# Patient Record
Sex: Male | Born: 1935 | Race: White | Hispanic: No | Smoking: Former smoker
Health system: Southern US, Community
[De-identification: ages and names within clinical notes are randomized; demographics above are authoritative.]

## PROBLEM LIST (undated history)

## (undated) DIAGNOSIS — I10 Essential (primary) hypertension: Secondary | ICD-10-CM

## (undated) DIAGNOSIS — E7800 Pure hypercholesterolemia, unspecified: Secondary | ICD-10-CM

## (undated) DIAGNOSIS — H409 Unspecified glaucoma: Secondary | ICD-10-CM

## (undated) HISTORY — PX: CATARACT EXTRACTION, BILATERAL: SHX1313

## (undated) HISTORY — PX: HERNIA REPAIR: SHX51

## (undated) HISTORY — PX: COLONOSCOPY: SHX174

## (undated) HISTORY — PX: OTHER SURGICAL HISTORY: SHX169

---

## 1998-08-04 ENCOUNTER — Ambulatory Visit (HOSPITAL_COMMUNITY): Admission: RE | Admit: 1998-08-04 | Discharge: 1998-08-04 | Payer: Self-pay | Admitting: Specialist

## 2002-08-14 ENCOUNTER — Ambulatory Visit (HOSPITAL_COMMUNITY): Admission: RE | Admit: 2002-08-14 | Discharge: 2002-08-14 | Payer: Self-pay | Admitting: Internal Medicine

## 2011-05-23 DIAGNOSIS — H4010X0 Unspecified open-angle glaucoma, stage unspecified: Secondary | ICD-10-CM | POA: Diagnosis not present

## 2011-05-23 DIAGNOSIS — H0019 Chalazion unspecified eye, unspecified eyelid: Secondary | ICD-10-CM | POA: Diagnosis not present

## 2011-05-26 DIAGNOSIS — E785 Hyperlipidemia, unspecified: Secondary | ICD-10-CM | POA: Diagnosis not present

## 2011-05-26 DIAGNOSIS — E781 Pure hyperglyceridemia: Secondary | ICD-10-CM | POA: Diagnosis not present

## 2011-05-26 DIAGNOSIS — F411 Generalized anxiety disorder: Secondary | ICD-10-CM | POA: Diagnosis not present

## 2011-05-26 DIAGNOSIS — I10 Essential (primary) hypertension: Secondary | ICD-10-CM | POA: Diagnosis not present

## 2011-05-26 DIAGNOSIS — Z125 Encounter for screening for malignant neoplasm of prostate: Secondary | ICD-10-CM | POA: Diagnosis not present

## 2011-05-26 DIAGNOSIS — E119 Type 2 diabetes mellitus without complications: Secondary | ICD-10-CM | POA: Diagnosis not present

## 2011-08-01 DIAGNOSIS — H4011X0 Primary open-angle glaucoma, stage unspecified: Secondary | ICD-10-CM | POA: Diagnosis not present

## 2011-08-01 DIAGNOSIS — H409 Unspecified glaucoma: Secondary | ICD-10-CM | POA: Diagnosis not present

## 2011-09-14 DIAGNOSIS — H409 Unspecified glaucoma: Secondary | ICD-10-CM | POA: Diagnosis not present

## 2011-09-14 DIAGNOSIS — H4010X0 Unspecified open-angle glaucoma, stage unspecified: Secondary | ICD-10-CM | POA: Diagnosis not present

## 2011-09-14 DIAGNOSIS — H0019 Chalazion unspecified eye, unspecified eyelid: Secondary | ICD-10-CM | POA: Diagnosis not present

## 2011-09-14 DIAGNOSIS — H4011X0 Primary open-angle glaucoma, stage unspecified: Secondary | ICD-10-CM | POA: Diagnosis not present

## 2012-04-29 DIAGNOSIS — E782 Mixed hyperlipidemia: Secondary | ICD-10-CM | POA: Diagnosis not present

## 2012-04-29 DIAGNOSIS — R7309 Other abnormal glucose: Secondary | ICD-10-CM | POA: Diagnosis not present

## 2012-04-30 DIAGNOSIS — E669 Obesity, unspecified: Secondary | ICD-10-CM | POA: Diagnosis not present

## 2012-04-30 DIAGNOSIS — I10 Essential (primary) hypertension: Secondary | ICD-10-CM | POA: Diagnosis not present

## 2012-04-30 DIAGNOSIS — Z23 Encounter for immunization: Secondary | ICD-10-CM | POA: Diagnosis not present

## 2012-04-30 DIAGNOSIS — E785 Hyperlipidemia, unspecified: Secondary | ICD-10-CM | POA: Diagnosis not present

## 2012-06-27 DIAGNOSIS — H353190 Nonexudative age-related macular degeneration, unspecified eye, stage unspecified: Secondary | ICD-10-CM | POA: Diagnosis not present

## 2012-06-27 DIAGNOSIS — H43819 Vitreous degeneration, unspecified eye: Secondary | ICD-10-CM | POA: Diagnosis not present

## 2012-06-27 DIAGNOSIS — H409 Unspecified glaucoma: Secondary | ICD-10-CM | POA: Diagnosis not present

## 2012-06-27 DIAGNOSIS — H4011X0 Primary open-angle glaucoma, stage unspecified: Secondary | ICD-10-CM | POA: Diagnosis not present

## 2012-10-23 DIAGNOSIS — J392 Other diseases of pharynx: Secondary | ICD-10-CM | POA: Diagnosis not present

## 2012-10-23 DIAGNOSIS — R350 Frequency of micturition: Secondary | ICD-10-CM | POA: Diagnosis not present

## 2012-10-23 DIAGNOSIS — J069 Acute upper respiratory infection, unspecified: Secondary | ICD-10-CM | POA: Diagnosis not present

## 2012-11-14 DIAGNOSIS — H2510 Age-related nuclear cataract, unspecified eye: Secondary | ICD-10-CM | POA: Diagnosis not present

## 2012-11-14 DIAGNOSIS — H353190 Nonexudative age-related macular degeneration, unspecified eye, stage unspecified: Secondary | ICD-10-CM | POA: Diagnosis not present

## 2012-11-14 DIAGNOSIS — H409 Unspecified glaucoma: Secondary | ICD-10-CM | POA: Diagnosis not present

## 2012-11-14 DIAGNOSIS — H47239 Glaucomatous optic atrophy, unspecified eye: Secondary | ICD-10-CM | POA: Diagnosis not present

## 2012-11-14 DIAGNOSIS — H43819 Vitreous degeneration, unspecified eye: Secondary | ICD-10-CM | POA: Diagnosis not present

## 2013-04-17 DIAGNOSIS — H353190 Nonexudative age-related macular degeneration, unspecified eye, stage unspecified: Secondary | ICD-10-CM | POA: Diagnosis not present

## 2013-04-17 DIAGNOSIS — H409 Unspecified glaucoma: Secondary | ICD-10-CM | POA: Diagnosis not present

## 2013-04-17 DIAGNOSIS — H47239 Glaucomatous optic atrophy, unspecified eye: Secondary | ICD-10-CM | POA: Diagnosis not present

## 2013-04-17 DIAGNOSIS — H43819 Vitreous degeneration, unspecified eye: Secondary | ICD-10-CM | POA: Diagnosis not present

## 2013-04-28 DIAGNOSIS — Z0000 Encounter for general adult medical examination without abnormal findings: Secondary | ICD-10-CM | POA: Diagnosis not present

## 2013-04-28 DIAGNOSIS — Z79899 Other long term (current) drug therapy: Secondary | ICD-10-CM | POA: Diagnosis not present

## 2013-04-28 DIAGNOSIS — Z6830 Body mass index (BMI) 30.0-30.9, adult: Secondary | ICD-10-CM | POA: Diagnosis not present

## 2013-04-28 DIAGNOSIS — Z23 Encounter for immunization: Secondary | ICD-10-CM | POA: Diagnosis not present

## 2013-04-30 ENCOUNTER — Encounter (INDEPENDENT_AMBULATORY_CARE_PROVIDER_SITE_OTHER): Payer: Self-pay | Admitting: *Deleted

## 2013-05-23 ENCOUNTER — Telehealth (INDEPENDENT_AMBULATORY_CARE_PROVIDER_SITE_OTHER): Payer: Self-pay | Admitting: *Deleted

## 2013-05-23 ENCOUNTER — Encounter (INDEPENDENT_AMBULATORY_CARE_PROVIDER_SITE_OTHER): Payer: Self-pay | Admitting: *Deleted

## 2013-05-23 ENCOUNTER — Other Ambulatory Visit (INDEPENDENT_AMBULATORY_CARE_PROVIDER_SITE_OTHER): Payer: Self-pay | Admitting: *Deleted

## 2013-05-23 DIAGNOSIS — Z1211 Encounter for screening for malignant neoplasm of colon: Secondary | ICD-10-CM

## 2013-05-23 MED ORDER — PEG-KCL-NACL-NASULF-NA ASC-C 100 G PO SOLR: 1.0000 | Freq: Once | ORAL | Status: DC

## 2013-05-23 NOTE — Telephone Encounter (Signed)
Patient needs movi prep 

## 2013-06-19 DIAGNOSIS — H47239 Glaucomatous optic atrophy, unspecified eye: Secondary | ICD-10-CM | POA: Diagnosis not present

## 2013-06-19 DIAGNOSIS — H4011X0 Primary open-angle glaucoma, stage unspecified: Secondary | ICD-10-CM | POA: Diagnosis not present

## 2013-06-19 DIAGNOSIS — H3530 Unspecified macular degeneration: Secondary | ICD-10-CM | POA: Diagnosis not present

## 2013-06-19 DIAGNOSIS — H43819 Vitreous degeneration, unspecified eye: Secondary | ICD-10-CM | POA: Diagnosis not present

## 2013-06-23 ENCOUNTER — Encounter (HOSPITAL_COMMUNITY): Payer: Self-pay | Admitting: Pharmacy Technician

## 2013-06-25 ENCOUNTER — Telehealth (INDEPENDENT_AMBULATORY_CARE_PROVIDER_SITE_OTHER): Payer: Self-pay | Admitting: *Deleted

## 2013-06-25 NOTE — Telephone Encounter (Signed)
Agree.  Please list their allergies.

## 2013-06-25 NOTE — Telephone Encounter (Signed)
  Procedure: tcs  Reason/Indication:  screening  Has patient had this procedure before?  Yes, 10 years ago  If so, when, by whom and where?    Is there a family history of colon cancer?  no  Who?  What age when diagnosed?    Is patient diabetic?   no      Does patient have prosthetic heart valve?  no  Do you have a pacemaker?  no  Has patient ever had endocarditis? no  Has patient had joint replacement within last 12 months?  no  Does patient tend to be constipated or take laxatives? no  Is patient on Coumadin, Plavix and/or Aspirin? yes  Medications: asa 325 mg daily, lipitor 20 mg 1/2 tab daily, diovan 160 mg /2 tab daily, fish oil  Allergies: see EPIC  Medication Adjustment: asa 2 days  Procedure date & time: 07/24/13 at 730

## 2013-07-24 ENCOUNTER — Ambulatory Visit (HOSPITAL_COMMUNITY)
Admission: RE | Admit: 2013-07-24 | Discharge: 2013-07-24 | Disposition: A | Discharge status: Home / Self Care | Payer: Medicare Other | Source: Ambulatory Visit | Attending: Internal Medicine | Admitting: Internal Medicine

## 2013-07-24 ENCOUNTER — Encounter (HOSPITAL_COMMUNITY): Payer: Self-pay | Admitting: *Deleted

## 2013-07-24 ENCOUNTER — Encounter (HOSPITAL_COMMUNITY)
Admission: RE | Disposition: A | Discharge status: Home / Self Care | Payer: Self-pay | Source: Ambulatory Visit | Attending: Internal Medicine

## 2013-07-24 DIAGNOSIS — E7800 Pure hypercholesterolemia, unspecified: Secondary | ICD-10-CM | POA: Diagnosis not present

## 2013-07-24 DIAGNOSIS — K644 Residual hemorrhoidal skin tags: Secondary | ICD-10-CM | POA: Insufficient documentation

## 2013-07-24 DIAGNOSIS — I10 Essential (primary) hypertension: Secondary | ICD-10-CM | POA: Insufficient documentation

## 2013-07-24 DIAGNOSIS — Z79899 Other long term (current) drug therapy: Secondary | ICD-10-CM | POA: Insufficient documentation

## 2013-07-24 DIAGNOSIS — Z87891 Personal history of nicotine dependence: Secondary | ICD-10-CM | POA: Diagnosis not present

## 2013-07-24 DIAGNOSIS — Z1211 Encounter for screening for malignant neoplasm of colon: Secondary | ICD-10-CM | POA: Diagnosis not present

## 2013-07-24 DIAGNOSIS — D126 Benign neoplasm of colon, unspecified: Secondary | ICD-10-CM | POA: Insufficient documentation

## 2013-07-24 DIAGNOSIS — K5730 Diverticulosis of large intestine without perforation or abscess without bleeding: Secondary | ICD-10-CM | POA: Insufficient documentation

## 2013-07-24 DIAGNOSIS — Z7982 Long term (current) use of aspirin: Secondary | ICD-10-CM | POA: Insufficient documentation

## 2013-07-24 DIAGNOSIS — K648 Other hemorrhoids: Secondary | ICD-10-CM | POA: Insufficient documentation

## 2013-07-24 HISTORY — DX: Unspecified glaucoma: H40.9

## 2013-07-24 HISTORY — PX: COLONOSCOPY: SHX5424

## 2013-07-24 HISTORY — DX: Pure hypercholesterolemia, unspecified: E78.00

## 2013-07-24 HISTORY — DX: Essential (primary) hypertension: I10

## 2013-07-24 SURGERY — COLONOSCOPY
Anesthesia: Moderate Sedation

## 2013-07-24 MED ORDER — MEPERIDINE HCL 50 MG/ML IJ SOLN
INTRAMUSCULAR | Status: AC
  Filled 2013-07-24: qty 1

## 2013-07-24 MED ORDER — SODIUM CHLORIDE 0.9 % IV SOLN
INTRAVENOUS | Status: DC
  Administered 2013-07-24: 07:00:00 via INTRAVENOUS

## 2013-07-24 MED ORDER — ATROPINE SULFATE 1 MG/ML IJ SOLN
INTRAMUSCULAR | Status: AC
  Filled 2013-07-24: qty 1

## 2013-07-24 MED ORDER — MEPERIDINE HCL 50 MG/ML IJ SOLN
INTRAMUSCULAR | Status: DC | PRN
  Administered 2013-07-24: 25 mg via INTRAVENOUS

## 2013-07-24 MED ORDER — STERILE WATER FOR IRRIGATION IR SOLN
Status: DC | PRN
  Administered 2013-07-24: 07:00:00

## 2013-07-24 MED ORDER — MIDAZOLAM HCL 5 MG/5ML IJ SOLN
INTRAMUSCULAR | Status: AC
  Filled 2013-07-24: qty 10

## 2013-07-24 MED ORDER — MIDAZOLAM HCL 5 MG/5ML IJ SOLN
INTRAMUSCULAR | Status: DC | PRN
  Administered 2013-07-24: 1 mg via INTRAVENOUS
  Administered 2013-07-24 (×2): 2 mg via INTRAVENOUS

## 2013-07-24 MED ORDER — ATROPINE SULFATE 1 MG/ML IJ SOLN
INTRAMUSCULAR | Status: DC | PRN
  Administered 2013-07-24: .5 mg via INTRAVENOUS

## 2013-07-24 NOTE — H&P (Signed)
Kirk Taylor is an 80 y.o. male.   Chief Complaint: Patient's here for colonoscopy. HPI: Patient is 80 year old Caucasian male who is here for screening colonoscopy. He denies abdominal pain change in his bowel habits or rectal bleeding. Patient's Lasix was 11 years ago. Family history is negative for CRC.  Past Medical History  Diagnosis Date  . Hypertension   . Hypercholesteremia   . Glaucoma     Past Surgical History  Procedure Laterality Date  . Colonoscopy    . Cataract extraction, bilateral    . Bilateral laser eye surgery    . Hernia repair Bilateral     Family History  Problem Relation Age of Onset  . Colon cancer Neg Hx    Social History:  reports that he has quit smoking. His smoking use included Cigarettes. He has a 5 pack-year smoking history. He does not have any smokeless tobacco history on file. He reports that he drinks about 3.5 ounces of alcohol per week. He reports that he does not use illicit drugs.  Allergies:  Allergies  Allergen Reactions  . Codeine Itching    Causes burning   . Latex     Causes skin irritation and burning    Medications Prior to Admission  Medication Sig Dispense Refill  . aspirin EC 81 MG tablet Take 81 mg by mouth daily.      Marland Kitchen atorvastatin (LIPITOR) 20 MG tablet Take 10 mg by mouth daily.      . dorzolamide-timolol (COSOPT) 22.3-6.8 MG/ML ophthalmic solution Place 1 drop into the left eye 2 (two) times daily.      . Multiple Vitamins-Minerals (OCUVITE PRESERVISION PO) Take 1 tablet by mouth 2 (two) times daily.      Marland Kitchen NIACIN PO Take 1 tablet by mouth daily.      . Omega-3 Fatty Acids (FISH OIL PO) Take 4,000 mg by mouth daily.      . Tafluprost (ZIOPTAN) 0.0015 % SOLN Place 1 drop into both eyes at bedtime.      . valsartan (DIOVAN) 160 MG tablet Take 80 mg by mouth daily.        No results found for this or any previous visit (from the past 48 hour(s)). No results found.  ROS  Blood pressure 155/73, pulse 69,  temperature 97.7 F (36.5 C), temperature source Oral, resp. rate 14, height 5\' 10"  (1.778 m), weight 190 lb (86.183 kg), SpO2 97.00%. Physical Exam  Constitutional: He appears well-developed and well-nourished.  HENT:  Mouth/Throat: Oropharynx is clear and moist.  Eyes: Conjunctivae are normal.  Neck: No thyromegaly present.  Cardiovascular: Normal rate, regular rhythm and normal heart sounds.   No murmur heard. Respiratory: Effort normal and breath sounds normal.  GI: Soft. He exhibits no distension and no mass. There is no tenderness.  Musculoskeletal: He exhibits no edema.  Lymphadenopathy:    He has no cervical adenopathy.  Neurological: He is alert.  Skin: Skin is warm and dry.     Assessment/Plan Average risk screening colonoscopy.  REHMAN,NAJEEB U 07/24/2013, 7:31 AM

## 2013-07-24 NOTE — Op Note (Signed)
COLONOSCOPY PROCEDURE REPORT  PATIENT:  Kirk Taylor  MR#:  [id number] Birthdate:  06/16/1933, 80 y.o., male Endoscopist:  Dr. Rogene Houston, MD Referred By:  Dr. Rocky Morel, MD  Procedure Date: 07/24/2013  Procedure:   Colonoscopy  Indications: Patient is 80 year old Caucasian male who is here for average risk screening colonoscopy. His last exam was in April 2004.  Informed Consent:  The procedure and risks were reviewed with the patient and informed consent was obtained.  Medications:  Demerol 25 mg IV Versed 5 mg IV Atropine 0.5 mg IV for asymptomatic bradycardia.  Description of procedure:  After a digital rectal exam was performed, that colonoscope was advanced from the anus through the rectum and colon to the area of the cecum, ileocecal valve and appendiceal orifice. The cecum was deeply intubated. These structures were well-seen and photographed for the record. From the level of the cecum and ileocecal valve, the scope was slowly and cautiously withdrawn. The mucosal surfaces were carefully surveyed utilizing scope tip to flexion to facilitate fold flattening as needed. The scope was pulled down into the rectum where a thorough exam including retroflexion was performed.  Findings:   Prep satisfactory. Scattered diverticula at sigmoid colon.  7 mm polyp snared from the distal sigmoid colon. Small hemorrhoids above and below the dentate line.   Therapeutic/Diagnostic Maneuvers Performed:  See above  Complications:  None  Cecal Withdrawal Time:  12 minutes  Impression:  Examination performed to cecum. Scattered diverticula at sigmoid colon. 7 mm polyp snared from distal sigmoid colon. Internal/external hemorrhoids.  Recommendations:  Standard instructions given. High fiber diet. I will contact patient with biopsy results and further recommendations.  Jalisha Enneking U  07/24/2013 8:20 AM  CC: Dr. Ethlyn Gallery, Kassie Mends, MD & Dr. Rayne Du ref. provider  found

## 2013-07-24 NOTE — Discharge Instructions (Signed)
Resume usual medications except no aspirin or NSAIDs for 1 week High fiber diet. No driving for 24 hours. Physician will call with biopsy results.  Colonoscopy, Care After Refer to this sheet in the next few weeks. These instructions provide you with information on caring for yourself after your procedure. Your health care provider may also give you more specific instructions. Your treatment has been planned according to current medical practices, but problems sometimes occur. Call your health care provider if you have any problems or questions after your procedure. WHAT TO EXPECT AFTER THE PROCEDURE  After your procedure, it is typical to have the following:  A small amount of blood in your stool.  Moderate amounts of gas and mild abdominal cramping or bloating. HOME CARE INSTRUCTIONS  Do not drive, operate machinery, or sign important documents for 24 hours.  You may shower and resume your regular physical activities, but move at a slower pace for the first 24 hours.  Take frequent rest periods for the first 24 hours.  Walk around or put a warm pack on your abdomen to help reduce abdominal cramping and bloating.  Drink enough fluids to keep your urine clear or pale yellow.  You may resume your normal diet as instructed by your health care provider. Avoid heavy or fried foods that are hard to digest.  Avoid drinking alcohol for 24 hours or as instructed by your health care provider.  Only take over-the-counter or prescription medicines as directed by your health care provider.  If a tissue sample (biopsy) was taken during your procedure:  Do not take aspirin or blood thinners for 7 days, or as instructed by your health care provider.  Do not drink alcohol for 7 days, or as instructed by your health care provider.  Eat soft foods for the first 24 hours. SEEK MEDICAL CARE IF: You have persistent spotting of blood in your stool 2 3 days after the procedure. SEEK IMMEDIATE  MEDICAL CARE IF:  You have more than a small spotting of blood in your stool.  You pass large blood clots in your stool.  Your abdomen is swollen (distended).  You have nausea or vomiting.  You have a fever.  You have increasing abdominal pain that is not relieved with medicine.   High-Fiber Diet Fiber is found in fruits, vegetables, and grains. A high-fiber diet encourages the addition of more whole grains, legumes, fruits, and vegetables in your diet. The recommended amount of fiber for adult males is 38 g per day. For adult females, it is 25 g per day. Pregnant and lactating women should get 28 g of fiber per day. If you have a digestive or bowel problem, ask your caregiver for advice before adding high-fiber foods to your diet. Eat a variety of high-fiber foods instead of only a select few type of foods.  PURPOSE  To increase stool bulk.  To make bowel movements more regular to prevent constipation.  To lower cholesterol.  To prevent overeating. WHEN IS THIS DIET USED?  It may be used if you have constipation and hemorrhoids.  It may be used if you have uncomplicated diverticulosis (intestine condition) and irritable bowel syndrome.  It may be used if you need help with weight management.  It may be used if you want to add it to your diet as a protective measure against atherosclerosis, diabetes, and cancer. SOURCES OF FIBER  Whole-grain breads and cereals.  Fruits, such as apples, oranges, bananas, berries, prunes, and pears.  Vegetables,  such as green peas, carrots, sweet potatoes, beets, broccoli, cabbage, spinach, and artichokes.  Legumes, such split peas, soy, lentils.  Almonds. FIBER CONTENT IN FOODS Starches and Grains / Dietary Fiber (g)  Cheerios, 1 cup / 3 g  Corn Flakes cereal, 1 cup / 0.7 g  Rice crispy treat cereal, 1 cup / 0.3 g  Instant oatmeal (cooked),  cup / 2 g  Frosted wheat cereal, 1 cup / 5.1 g  Brown, long-grain rice (cooked), 1  cup / 3.5 g  White, long-grain rice (cooked), 1 cup / 0.6 g  Enriched macaroni (cooked), 1 cup / 2.5 g Legumes / Dietary Fiber (g)  Baked beans (canned, plain, or vegetarian),  cup / 5.2 g  Kidney beans (canned),  cup / 6.8 g  Pinto beans (cooked),  cup / 5.5 g Breads and Crackers / Dietary Fiber (g)  Plain or honey graham crackers, 2 squares / 0.7 g  Saltine crackers, 3 squares / 0.3 g  Plain, salted pretzels, 10 pieces / 1.8 g  Whole-wheat bread, 1 slice / 1.9 g  White bread, 1 slice / 0.7 g  Raisin bread, 1 slice / 1.2 g  Plain bagel, 3 oz / 2 g  Flour tortilla, 1 oz / 0.9 g  Corn tortilla, 1 small / 1.5 g  Hamburger or hotdog bun, 1 small / 0.9 g Fruits / Dietary Fiber (g)  Apple with skin, 1 medium / 4.4 g  Sweetened applesauce,  cup / 1.5 g  Banana,  medium / 1.5 g  Grapes, 10 grapes / 0.4 g  Orange, 1 small / 2.3 g  Raisin, 1.5 oz / 1.6 g  Melon, 1 cup / 1.4 g Vegetables / Dietary Fiber (g)  Green beans (canned),  cup / 1.3 g  Carrots (cooked),  cup / 2.3 g  Broccoli (cooked),  cup / 2.8 g  Peas (cooked),  cup / 4.4 g  Mashed potatoes,  cup / 1.6 g  Lettuce, 1 cup / 0.5 g  Corn (canned),  cup / 1.6 g  Tomato,  cup / 1.1 g

## 2013-07-29 ENCOUNTER — Encounter (HOSPITAL_COMMUNITY): Payer: Self-pay | Admitting: Internal Medicine

## 2013-08-07 ENCOUNTER — Encounter (INDEPENDENT_AMBULATORY_CARE_PROVIDER_SITE_OTHER): Payer: Self-pay | Admitting: *Deleted

## 2013-09-16 DIAGNOSIS — H43819 Vitreous degeneration, unspecified eye: Secondary | ICD-10-CM | POA: Diagnosis not present

## 2013-09-16 DIAGNOSIS — H4011X0 Primary open-angle glaucoma, stage unspecified: Secondary | ICD-10-CM | POA: Diagnosis not present

## 2013-09-16 DIAGNOSIS — H47239 Glaucomatous optic atrophy, unspecified eye: Secondary | ICD-10-CM | POA: Diagnosis not present

## 2014-01-20 DIAGNOSIS — H47239 Glaucomatous optic atrophy, unspecified eye: Secondary | ICD-10-CM | POA: Diagnosis not present

## 2014-01-20 DIAGNOSIS — H3530 Unspecified macular degeneration: Secondary | ICD-10-CM | POA: Diagnosis not present

## 2014-01-20 DIAGNOSIS — H16229 Keratoconjunctivitis sicca, not specified as Sjogren's, unspecified eye: Secondary | ICD-10-CM | POA: Diagnosis not present

## 2014-06-24 DIAGNOSIS — Z125 Encounter for screening for malignant neoplasm of prostate: Secondary | ICD-10-CM | POA: Diagnosis not present

## 2014-06-24 DIAGNOSIS — E782 Mixed hyperlipidemia: Secondary | ICD-10-CM | POA: Diagnosis not present

## 2014-06-24 DIAGNOSIS — Z6831 Body mass index (BMI) 31.0-31.9, adult: Secondary | ICD-10-CM | POA: Diagnosis not present

## 2014-06-24 DIAGNOSIS — R7301 Impaired fasting glucose: Secondary | ICD-10-CM | POA: Diagnosis not present

## 2014-06-24 DIAGNOSIS — J069 Acute upper respiratory infection, unspecified: Secondary | ICD-10-CM | POA: Diagnosis not present

## 2014-06-24 DIAGNOSIS — Z0000 Encounter for general adult medical examination without abnormal findings: Secondary | ICD-10-CM | POA: Diagnosis not present

## 2014-07-07 DIAGNOSIS — E6609 Other obesity due to excess calories: Secondary | ICD-10-CM | POA: Diagnosis not present

## 2014-07-07 DIAGNOSIS — Z6831 Body mass index (BMI) 31.0-31.9, adult: Secondary | ICD-10-CM | POA: Diagnosis not present

## 2014-07-07 DIAGNOSIS — K5289 Other specified noninfective gastroenteritis and colitis: Secondary | ICD-10-CM | POA: Diagnosis not present

## 2014-07-14 DIAGNOSIS — Z6830 Body mass index (BMI) 30.0-30.9, adult: Secondary | ICD-10-CM | POA: Diagnosis not present

## 2014-07-14 DIAGNOSIS — E6609 Other obesity due to excess calories: Secondary | ICD-10-CM | POA: Diagnosis not present

## 2014-07-14 DIAGNOSIS — A09 Infectious gastroenteritis and colitis, unspecified: Secondary | ICD-10-CM | POA: Diagnosis not present

## 2014-07-23 DIAGNOSIS — H16223 Keratoconjunctivitis sicca, not specified as Sjogren's, bilateral: Secondary | ICD-10-CM | POA: Diagnosis not present

## 2014-07-23 DIAGNOSIS — H4011X3 Primary open-angle glaucoma, severe stage: Secondary | ICD-10-CM | POA: Diagnosis not present

## 2014-07-31 DIAGNOSIS — E6609 Other obesity due to excess calories: Secondary | ICD-10-CM | POA: Diagnosis not present

## 2014-07-31 DIAGNOSIS — I10 Essential (primary) hypertension: Secondary | ICD-10-CM | POA: Diagnosis not present

## 2014-07-31 DIAGNOSIS — Z6830 Body mass index (BMI) 30.0-30.9, adult: Secondary | ICD-10-CM | POA: Diagnosis not present

## 2014-07-31 DIAGNOSIS — A09 Infectious gastroenteritis and colitis, unspecified: Secondary | ICD-10-CM | POA: Diagnosis not present

## 2014-07-31 DIAGNOSIS — E782 Mixed hyperlipidemia: Secondary | ICD-10-CM | POA: Diagnosis not present

## 2014-08-31 DIAGNOSIS — E663 Overweight: Secondary | ICD-10-CM | POA: Diagnosis not present

## 2014-08-31 DIAGNOSIS — A047 Enterocolitis due to Clostridium difficile: Secondary | ICD-10-CM | POA: Diagnosis not present

## 2014-08-31 DIAGNOSIS — Z6829 Body mass index (BMI) 29.0-29.9, adult: Secondary | ICD-10-CM | POA: Diagnosis not present

## 2014-09-22 ENCOUNTER — Encounter (INDEPENDENT_AMBULATORY_CARE_PROVIDER_SITE_OTHER): Payer: Self-pay | Admitting: *Deleted

## 2014-09-24 ENCOUNTER — Encounter (INDEPENDENT_AMBULATORY_CARE_PROVIDER_SITE_OTHER): Payer: Self-pay | Admitting: Internal Medicine

## 2014-09-24 ENCOUNTER — Ambulatory Visit (INDEPENDENT_AMBULATORY_CARE_PROVIDER_SITE_OTHER): Payer: Medicare Other | Admitting: Internal Medicine

## 2014-09-24 VITALS — BP 180/70 | HR 70 | Temp 97.8°F | Ht 70.0 in | Wt 188.2 lb

## 2014-09-24 DIAGNOSIS — A047 Enterocolitis due to Clostridium difficile: Secondary | ICD-10-CM

## 2014-09-24 DIAGNOSIS — H409 Unspecified glaucoma: Secondary | ICD-10-CM | POA: Insufficient documentation

## 2014-09-24 DIAGNOSIS — I10 Essential (primary) hypertension: Secondary | ICD-10-CM | POA: Insufficient documentation

## 2014-09-24 DIAGNOSIS — E7800 Pure hypercholesterolemia, unspecified: Secondary | ICD-10-CM | POA: Insufficient documentation

## 2014-09-24 DIAGNOSIS — A0472 Enterocolitis due to Clostridium difficile, not specified as recurrent: Secondary | ICD-10-CM

## 2014-09-24 NOTE — Progress Notes (Signed)
Subjective:    Patient ID: Kirk Taylor, male    DOB: 23-Nov-1933, 81 y.o.   MRN: [id number]  HPI Referred to our office by [place] (Dr. Gerarda Fraction) for C-diff. C-diff diagnosed about 8 weeks ago (March). He had been on antibiotics. He was placed on Amoxicillin for a sinus infection. He thinks the Amoxcillin started his problems. He developed diarrhea after 3-4 days and he stopped the Amoxicillin.  He has been treated with Flagyl  X 20 days and then Vancomycin  ? 225 mg qid  x 3 weeks. He finished the Vancomycin 2 days ago.  He tells me his stool are fine today.  His stools are formed now and soft.  He usually has one stool a day. He has not seen any mucous. No rectal bleeding. Stools are not foul smelling now.  Appetite is good. No weight loss. He is on a Probiotic    07/23/2013 Colonoscopy Dr. Laural Golden: Average risk: Impression:   Examination performed to cecum. Scattered diverticula at sigmoid colon. 7 mm polyp snared from distal sigmoid colon. Biopsy: Polyp was a tubular adenoma.  . 07/11/2014 Stool culture negative.  07/07/2014 C diff positive 06/24/2014 WBC 5.9, H and H 13.5 and 38.8, MCV 91, Albumin 3.9, total bili 0.2, ALP 68, AST 26, ALT 18        Review of Systems Past Medical History  Diagnosis Date  . Hypertension   . Hypercholesteremia   . Glaucoma     Past Surgical History  Procedure Laterality Date  . Colonoscopy    . Cataract extraction, bilateral    . Bilateral laser eye surgery    . Hernia repair Bilateral   . Colonoscopy N/A 07/24/2013    Procedure: COLONOSCOPY;  Surgeon: Rogene Houston, MD;  Location: [facility];  Service: Endoscopy;  Laterality: N/A;  730    Allergies  Allergen Reactions  . Codeine Itching    Causes burning   . Latex     Causes skin irritation and burning  . Tape     Current Outpatient Prescriptions on File Prior to Visit  Medication Sig Dispense Refill  . atorvastatin (LIPITOR) 20 MG tablet Take 10 mg by mouth daily.    .  dorzolamide-timolol (COSOPT) 22.3-6.8 MG/ML ophthalmic solution Place 1 drop into the left eye 2 (two) times daily.    . Multiple Vitamins-Minerals (OCUVITE PRESERVISION PO) Take 1 tablet by mouth 2 (two) times daily.    Marland Kitchen NIACIN PO Take 1 tablet by mouth daily.    . Omega-3 Fatty Acids (FISH OIL PO) Take 4,000 mg by mouth daily.    . Tafluprost (ZIOPTAN) 0.0015 % SOLN Place 1 drop into both eyes at bedtime.    . valsartan (DIOVAN) 160 MG tablet Take 80 mg by mouth daily.     No current facility-administered medications on file prior to visit.        Objective:   Physical Exam  Blood pressure 180/70, pulse 70, temperature 97.8 F (36.6 C), height 5\' 10"  (1.778 m), weight 188 lb 3.2 oz (85.367 kg).  Alert and oriented. Skin warm and dry. Oral mucosa is moist.   . Sclera anicteric, conjunctivae is pink. Thyroid not enlarged. No cervical lymphadenopathy. Lungs clear. Heart regular rate and rhythm.  Abdomen is soft. Bowel sounds are positive. No hepatomegaly. No abdominal masses felt. No tenderness.  No edema to lower extremities.       Assessment & Plan:  C diff. He stools have been formed for 2  days. OV in 4 weeks. If diarrhea reoccurs call our office.

## 2014-09-24 NOTE — Patient Instructions (Addendum)
OV in 2 weeks. If diarrhea reoccurs, call our office and we will get a c-diff.

## 2014-09-25 ENCOUNTER — Encounter (INDEPENDENT_AMBULATORY_CARE_PROVIDER_SITE_OTHER): Payer: Self-pay

## 2014-09-25 NOTE — Addendum Note (Signed)
Addended by: Butch Penny on: 09/25/2014 08:54 AM   Modules accepted: Level of Service

## 2014-10-12 ENCOUNTER — Telehealth (INDEPENDENT_AMBULATORY_CARE_PROVIDER_SITE_OTHER): Payer: Self-pay | Admitting: Internal Medicine

## 2014-10-12 DIAGNOSIS — A0472 Enterocolitis due to Clostridium difficile, not specified as recurrent: Secondary | ICD-10-CM

## 2014-10-12 NOTE — Telephone Encounter (Signed)
Labs for c diff ordered

## 2014-10-13 ENCOUNTER — Telehealth (INDEPENDENT_AMBULATORY_CARE_PROVIDER_SITE_OTHER): Payer: Self-pay | Admitting: Internal Medicine

## 2014-10-13 DIAGNOSIS — A0472 Enterocolitis due to Clostridium difficile, not specified as recurrent: Secondary | ICD-10-CM

## 2014-10-13 LAB — CLOSTRIDIUM DIFFICILE BY PCR: CDIFFPCR: DETECTED — AB

## 2014-10-13 MED ORDER — VANCOMYCIN HCL 125 MG PO CAPS: 125.0000 mg | ORAL_CAPSULE | Freq: Three times a day (TID) | ORAL | Status: DC

## 2014-10-13 NOTE — Telephone Encounter (Signed)
Rx sent to his pharmacy

## 2014-10-13 NOTE — Telephone Encounter (Signed)
Spoke with patient. Rx for Vancomycin

## 2014-10-14 ENCOUNTER — Telehealth (INDEPENDENT_AMBULATORY_CARE_PROVIDER_SITE_OTHER): Payer: Self-pay | Admitting: *Deleted

## 2014-10-14 NOTE — Telephone Encounter (Signed)
Sam said his father is coming to stay with him and his family. He is aware that we can discuss his fathers chart with him but would like to know how to protect his children from C-diff. If someone could please return his call at [phone].

## 2014-10-14 NOTE — Telephone Encounter (Signed)
Patient called in 10/13/2014 at 5:52 pm and would like for you to call him at his convenience.

## 2014-10-15 NOTE — Telephone Encounter (Signed)
Patient came by office today. Tammy is off. The PA will be done tomorrow when she returns.

## 2014-10-15 NOTE — Telephone Encounter (Signed)
I have talked with son. Did not release any information on patient. Gave precautions on C-diff.

## 2014-10-15 NOTE — Telephone Encounter (Signed)
Message left at home 

## 2014-10-16 ENCOUNTER — Telehealth (INDEPENDENT_AMBULATORY_CARE_PROVIDER_SITE_OTHER): Payer: Self-pay | Admitting: *Deleted

## 2014-10-16 NOTE — Telephone Encounter (Signed)
[facility] was called and a PA was completed for Vancomycin 250 mg Capsule - Take 1 by mouth three tiimes a day. #42 for 14 days. This is good through 12/11/14. Reference number is [id number]. Both the patient and the pharmacy have been notified.

## 2014-10-27 ENCOUNTER — Ambulatory Visit (INDEPENDENT_AMBULATORY_CARE_PROVIDER_SITE_OTHER): Payer: Medicare Other | Admitting: Internal Medicine

## 2014-10-27 ENCOUNTER — Encounter (INDEPENDENT_AMBULATORY_CARE_PROVIDER_SITE_OTHER): Payer: Self-pay | Admitting: Internal Medicine

## 2014-10-27 VITALS — BP 122/68 | HR 64 | Temp 97.8°F | Ht 70.5 in | Wt 186.0 lb

## 2014-10-27 DIAGNOSIS — B9689 Other specified bacterial agents as the cause of diseases classified elsewhere: Secondary | ICD-10-CM | POA: Diagnosis not present

## 2014-10-27 DIAGNOSIS — A498 Other bacterial infections of unspecified site: Secondary | ICD-10-CM

## 2014-10-27 NOTE — Progress Notes (Addendum)
Subjective:    Patient ID: Kirk Taylor, male    DOB: 08/27/1933, 81 y.o.   MRN: [id number]  HPI   Here today for f/u.  Recent hx of C difficile. Treated for C diff by Dr. Gerarda Fraction in March. He had been on Amoxicillin for a sinus infections. He developed diarrhea after being on the antibiotic for 3-4 days. He stopped the Amoxicillin.  He was treated with Flagyl x 20 days and then Vancomycin qid x 3 weeks. I recheck his GI pathogen on 10/12/2014 when he called and stated his diarrhea had returned. He was positive. He was started on Vancomycin 125mg  TID x 14 days. He will be finished Thursday. He tells me his stools are better. Stools are formed but soft.  No foul odor.  He is having one stool a day and sometimes he will skip a day. Appetite is good. Occasionally has some nausea. He still taking a probiotic    07/23/2013 Colonoscopy Dr. Laural Golden: Average risk: Impression:   Examination performed to cecum. Scattered diverticula at sigmoid colon. 7 mm polyp snared from distal sigmoid colon. Biopsy: Polyp was a tubular adenoma. . 07/11/2014 Stool culture negative.   07/07/2014 C diff positive 06/24/2014 WBC 5.9, H and H 13.5 and 38.8, MCV 91, Albumin 3.9, total bili 0.2, ALP 68, AST 26, ALT 18    Review of Systems Past Medical History  Diagnosis Date  . Hypertension   . Hypercholesteremia   . Glaucoma     Past Surgical History  Procedure Laterality Date  . Colonoscopy    . Cataract extraction, bilateral    . Bilateral laser eye surgery    . Hernia repair Bilateral   . Colonoscopy N/A 07/24/2013    Procedure: COLONOSCOPY;  Surgeon: Rogene Houston, MD;  Location: [facility];  Service: Endoscopy;  Laterality: N/A;  730    Allergies  Allergen Reactions  . Codeine Itching    Causes burning   . Latex     Causes skin irritation and burning  . Tape     Current Outpatient Prescriptions on File Prior to Visit  Medication Sig Dispense Refill  . atorvastatin (LIPITOR) 20 MG  tablet Take 10 mg by mouth daily.    . dorzolamide-timolol (COSOPT) 22.3-6.8 MG/ML ophthalmic solution Place 1 drop into the left eye 2 (two) times daily.    . Multiple Vitamins-Minerals (OCUVITE PRESERVISION PO) Take 1 tablet by mouth 2 (two) times daily.    Marland Kitchen NIACIN PO Take 1 tablet by mouth daily.    . Omega-3 Fatty Acids (FISH OIL PO) Take 4,000 mg by mouth daily.    . Tafluprost (ZIOPTAN) 0.0015 % SOLN Place 1 drop into both eyes at bedtime.    . valsartan (DIOVAN) 160 MG tablet Take 80 mg by mouth daily.    . vancomycin (VANCOCIN) 125 MG capsule Take 1 capsule (125 mg total) by mouth 3 (three) times daily. 42 capsule 0   No current facility-administered medications on file prior to visit.   Divorced. Works part time at [employer].      Objective:   Physical ExamBlood pressure 122/68, pulse 64, temperature 97.8 F (36.6 C), height 5' 10.5" (1.791 m), weight 186 lb (84.369 kg).  Alert and oriented. Skin warm and dry. Oral mucosa is moist.   . Sclera anicteric, conjunctivae is pink. Thyroid not enlarged. No cervical lymphadenopathy. Lungs clear. Heart regular rate and rhythm.  Abdomen is soft. Bowel sounds are positive. No hepatomegaly. No abdominal  masses felt. No tenderness.  No edema to lower extremities.       Assessment & Plan:  C difficile. Doing better. Stools are formed. Having one stool a day. Will finish Vancomycin in 2 days,. We will see back i  4 weeks. If diarrhea returns he will call our office and I will get a GI pathogen.

## 2014-10-27 NOTE — Patient Instructions (Signed)
OV in 4 weeks.  

## 2014-11-11 ENCOUNTER — Telehealth (INDEPENDENT_AMBULATORY_CARE_PROVIDER_SITE_OTHER): Payer: Self-pay | Admitting: *Deleted

## 2014-11-11 NOTE — Telephone Encounter (Signed)
Kirk Taylor said he was doing really good and will call if needed. He said Kirk Taylor told him he didn't need to come back if better. Has a question, how long should he continue taking the probiotic? His return phone number is [phone].

## 2014-11-12 NOTE — Telephone Encounter (Signed)
States he feels better. No diarrhea. Take the Probiotic for another week

## 2014-11-24 DIAGNOSIS — H16223 Keratoconjunctivitis sicca, not specified as Sjogren's, bilateral: Secondary | ICD-10-CM | POA: Diagnosis not present

## 2014-11-24 DIAGNOSIS — H4011X3 Primary open-angle glaucoma, severe stage: Secondary | ICD-10-CM | POA: Diagnosis not present

## 2015-03-09 DIAGNOSIS — H3530 Unspecified macular degeneration: Secondary | ICD-10-CM | POA: Diagnosis not present

## 2015-03-09 DIAGNOSIS — H16223 Keratoconjunctivitis sicca, not specified as Sjogren's, bilateral: Secondary | ICD-10-CM | POA: Diagnosis not present

## 2015-03-09 DIAGNOSIS — Z23 Encounter for immunization: Secondary | ICD-10-CM | POA: Diagnosis not present

## 2015-03-09 DIAGNOSIS — H401133 Primary open-angle glaucoma, bilateral, severe stage: Secondary | ICD-10-CM | POA: Diagnosis not present

## 2015-04-23 DIAGNOSIS — I10 Essential (primary) hypertension: Secondary | ICD-10-CM | POA: Diagnosis not present

## 2015-05-11 DIAGNOSIS — I10 Essential (primary) hypertension: Secondary | ICD-10-CM | POA: Diagnosis not present

## 2015-05-11 DIAGNOSIS — Z23 Encounter for immunization: Secondary | ICD-10-CM | POA: Diagnosis not present

## 2015-05-11 DIAGNOSIS — Z1389 Encounter for screening for other disorder: Secondary | ICD-10-CM | POA: Diagnosis not present

## 2015-05-11 DIAGNOSIS — Z6830 Body mass index (BMI) 30.0-30.9, adult: Secondary | ICD-10-CM | POA: Diagnosis not present

## 2015-05-11 DIAGNOSIS — A047 Enterocolitis due to Clostridium difficile: Secondary | ICD-10-CM | POA: Diagnosis not present

## 2015-05-11 DIAGNOSIS — E782 Mixed hyperlipidemia: Secondary | ICD-10-CM | POA: Diagnosis not present

## 2015-06-10 DIAGNOSIS — H401133 Primary open-angle glaucoma, bilateral, severe stage: Secondary | ICD-10-CM | POA: Diagnosis not present

## 2015-06-10 DIAGNOSIS — H16223 Keratoconjunctivitis sicca, not specified as Sjogren's, bilateral: Secondary | ICD-10-CM | POA: Diagnosis not present

## 2015-06-10 DIAGNOSIS — H3530 Unspecified macular degeneration: Secondary | ICD-10-CM | POA: Diagnosis not present

## 2015-06-11 ENCOUNTER — Other Ambulatory Visit (INDEPENDENT_AMBULATORY_CARE_PROVIDER_SITE_OTHER): Payer: Self-pay | Admitting: *Deleted

## 2015-06-11 DIAGNOSIS — Z1211 Encounter for screening for malignant neoplasm of colon: Secondary | ICD-10-CM

## 2015-07-16 DIAGNOSIS — Z0000 Encounter for general adult medical examination without abnormal findings: Secondary | ICD-10-CM | POA: Diagnosis not present

## 2015-07-16 DIAGNOSIS — Z6831 Body mass index (BMI) 31.0-31.9, adult: Secondary | ICD-10-CM | POA: Diagnosis not present

## 2015-07-16 DIAGNOSIS — Z1389 Encounter for screening for other disorder: Secondary | ICD-10-CM | POA: Diagnosis not present

## 2015-07-16 DIAGNOSIS — E6609 Other obesity due to excess calories: Secondary | ICD-10-CM | POA: Diagnosis not present

## 2015-07-27 DIAGNOSIS — H401133 Primary open-angle glaucoma, bilateral, severe stage: Secondary | ICD-10-CM | POA: Diagnosis not present

## 2015-07-27 DIAGNOSIS — H3530 Unspecified macular degeneration: Secondary | ICD-10-CM | POA: Diagnosis not present

## 2015-07-27 DIAGNOSIS — H16223 Keratoconjunctivitis sicca, not specified as Sjogren's, bilateral: Secondary | ICD-10-CM | POA: Diagnosis not present

## 2015-08-26 ENCOUNTER — Ambulatory Visit (HOSPITAL_COMMUNITY): Admit: 2015-08-26 | Payer: PRIVATE HEALTH INSURANCE | Admitting: Internal Medicine

## 2015-08-26 ENCOUNTER — Encounter (HOSPITAL_COMMUNITY): Payer: Self-pay

## 2015-08-26 SURGERY — COLONOSCOPY
Anesthesia: Moderate Sedation

## 2015-12-13 DIAGNOSIS — H3530 Unspecified macular degeneration: Secondary | ICD-10-CM | POA: Diagnosis not present

## 2015-12-13 DIAGNOSIS — H16223 Keratoconjunctivitis sicca, not specified as Sjogren's, bilateral: Secondary | ICD-10-CM | POA: Diagnosis not present

## 2015-12-13 DIAGNOSIS — H401133 Primary open-angle glaucoma, bilateral, severe stage: Secondary | ICD-10-CM | POA: Diagnosis not present

## 2016-02-01 DIAGNOSIS — H401133 Primary open-angle glaucoma, bilateral, severe stage: Secondary | ICD-10-CM | POA: Diagnosis not present

## 2016-02-01 DIAGNOSIS — H3530 Unspecified macular degeneration: Secondary | ICD-10-CM | POA: Diagnosis not present

## 2016-02-01 DIAGNOSIS — H16223 Keratoconjunctivitis sicca, not specified as Sjogren's, bilateral: Secondary | ICD-10-CM | POA: Diagnosis not present

## 2016-02-16 DIAGNOSIS — Z23 Encounter for immunization: Secondary | ICD-10-CM | POA: Diagnosis not present

## 2016-03-16 DIAGNOSIS — H3530 Unspecified macular degeneration: Secondary | ICD-10-CM | POA: Diagnosis not present

## 2016-03-16 DIAGNOSIS — H401133 Primary open-angle glaucoma, bilateral, severe stage: Secondary | ICD-10-CM | POA: Diagnosis not present

## 2016-03-16 DIAGNOSIS — H16223 Keratoconjunctivitis sicca, not specified as Sjogren's, bilateral: Secondary | ICD-10-CM | POA: Diagnosis not present

## 2016-04-06 DIAGNOSIS — J069 Acute upper respiratory infection, unspecified: Secondary | ICD-10-CM | POA: Diagnosis not present

## 2016-04-06 DIAGNOSIS — J181 Lobar pneumonia, unspecified organism: Secondary | ICD-10-CM | POA: Diagnosis not present

## 2016-04-18 DIAGNOSIS — H16223 Keratoconjunctivitis sicca, not specified as Sjogren's, bilateral: Secondary | ICD-10-CM | POA: Diagnosis not present

## 2016-04-18 DIAGNOSIS — H3530 Unspecified macular degeneration: Secondary | ICD-10-CM | POA: Diagnosis not present

## 2016-04-18 DIAGNOSIS — H401133 Primary open-angle glaucoma, bilateral, severe stage: Secondary | ICD-10-CM | POA: Diagnosis not present

## 2016-04-19 DIAGNOSIS — I10 Essential (primary) hypertension: Secondary | ICD-10-CM | POA: Diagnosis not present

## 2016-04-19 DIAGNOSIS — E669 Obesity, unspecified: Secondary | ICD-10-CM | POA: Diagnosis not present

## 2016-04-19 DIAGNOSIS — E782 Mixed hyperlipidemia: Secondary | ICD-10-CM | POA: Diagnosis not present

## 2016-04-19 DIAGNOSIS — Z6830 Body mass index (BMI) 30.0-30.9, adult: Secondary | ICD-10-CM | POA: Diagnosis not present

## 2016-04-19 DIAGNOSIS — F419 Anxiety disorder, unspecified: Secondary | ICD-10-CM | POA: Diagnosis not present

## 2016-04-19 DIAGNOSIS — E6609 Other obesity due to excess calories: Secondary | ICD-10-CM | POA: Diagnosis not present

## 2016-04-20 DIAGNOSIS — Z125 Encounter for screening for malignant neoplasm of prostate: Secondary | ICD-10-CM | POA: Diagnosis not present

## 2016-04-20 DIAGNOSIS — Z6830 Body mass index (BMI) 30.0-30.9, adult: Secondary | ICD-10-CM | POA: Diagnosis not present

## 2016-04-20 DIAGNOSIS — E6609 Other obesity due to excess calories: Secondary | ICD-10-CM | POA: Diagnosis not present

## 2016-04-20 DIAGNOSIS — Z1389 Encounter for screening for other disorder: Secondary | ICD-10-CM | POA: Diagnosis not present

## 2016-06-21 DIAGNOSIS — H401133 Primary open-angle glaucoma, bilateral, severe stage: Secondary | ICD-10-CM | POA: Diagnosis not present

## 2016-06-21 DIAGNOSIS — H3530 Unspecified macular degeneration: Secondary | ICD-10-CM | POA: Diagnosis not present

## 2016-06-21 DIAGNOSIS — H16223 Keratoconjunctivitis sicca, not specified as Sjogren's, bilateral: Secondary | ICD-10-CM | POA: Diagnosis not present

## 2016-08-23 DIAGNOSIS — E782 Mixed hyperlipidemia: Secondary | ICD-10-CM | POA: Diagnosis not present

## 2016-08-23 DIAGNOSIS — E781 Pure hyperglyceridemia: Secondary | ICD-10-CM | POA: Diagnosis not present

## 2016-08-25 DIAGNOSIS — D649 Anemia, unspecified: Secondary | ICD-10-CM | POA: Diagnosis not present

## 2016-08-25 DIAGNOSIS — E538 Deficiency of other specified B group vitamins: Secondary | ICD-10-CM | POA: Diagnosis not present

## 2016-08-30 ENCOUNTER — Encounter (INDEPENDENT_AMBULATORY_CARE_PROVIDER_SITE_OTHER): Payer: Self-pay | Admitting: Internal Medicine

## 2016-08-30 DIAGNOSIS — D649 Anemia, unspecified: Secondary | ICD-10-CM | POA: Diagnosis not present

## 2016-09-01 ENCOUNTER — Encounter (INDEPENDENT_AMBULATORY_CARE_PROVIDER_SITE_OTHER): Payer: Self-pay | Admitting: Internal Medicine

## 2016-09-01 ENCOUNTER — Encounter (INDEPENDENT_AMBULATORY_CARE_PROVIDER_SITE_OTHER): Payer: Self-pay

## 2016-09-01 ENCOUNTER — Ambulatory Visit (INDEPENDENT_AMBULATORY_CARE_PROVIDER_SITE_OTHER): Payer: Medicare Other | Admitting: Internal Medicine

## 2016-09-01 VITALS — BP 170/68 | HR 60 | Temp 98.3°F | Ht 69.5 in | Wt 196.5 lb

## 2016-09-01 DIAGNOSIS — D638 Anemia in other chronic diseases classified elsewhere: Secondary | ICD-10-CM | POA: Diagnosis not present

## 2016-09-01 NOTE — Progress Notes (Signed)
   Subjective:    Patient ID: Kirk Taylor, male    DOB: 1933/05/08, 83 y.o.   MRN: [id number]  HPI Referred by Dr. Ethlyn Gallery for anemia.  He says he is doing okay. Appetite is good. No weight loss. He says he was given 3 stool cards and all were negative.  There has been no change in his stools. Last colonoscopy in 2015.  Has been taking Vitamins with Iron   08/23/2016 H and H 11.2 and 33.4, MCV 87, ferritin 148, TIBC 240, Iron 113. % Sat 47  04/16/2017 H and H 12.3 and 36.4, MCV 92.    07/23/2013 Colonoscopy Dr. Laural Golden: Average risk: Impression:  Examination performed to cecum. Scattered diverticula at sigmoid colon. 7 mm polyp snared from distal sigmoid colon. Biopsy: Polyp was a tubular adenoma.   Review of Systems Past Medical History:  Diagnosis Date  . Glaucoma   . Hypercholesteremia   . Hypertension     Past Surgical History:  Procedure Laterality Date  . Bilateral Laser Eye Surgery    . CATARACT EXTRACTION, BILATERAL    . COLONOSCOPY    . COLONOSCOPY N/A 07/24/2013   Procedure: COLONOSCOPY;  Surgeon: Rogene Houston, MD;  Location: [facility];  Service: Endoscopy;  Laterality: N/A;  730  . HERNIA REPAIR Bilateral     Allergies  Allergen Reactions  . Codeine Itching    Causes burning   . Latex     Causes skin irritation and burning  . Tape     Current Outpatient Prescriptions on File Prior to Visit  Medication Sig Dispense Refill  . atorvastatin (LIPITOR) 20 MG tablet Take 10 mg by mouth daily.    . dorzolamide-timolol (COSOPT) 22.3-6.8 MG/ML ophthalmic solution Place 1 drop into the left eye 2 (two) times daily.    . Multiple Vitamins-Minerals (OCUVITE PRESERVISION PO) Take 1 tablet by mouth 2 (two) times daily.    Marland Kitchen NIACIN PO Take 1 tablet by mouth daily.    . valsartan (DIOVAN) 160 MG tablet Take 80 mg by mouth daily.     No current facility-administered medications on file prior to visit.        Objective:   Physical Exam Blood  pressure (!) 170/68, pulse 60, temperature 98.3 F (36.8 C), height 5' 9.5" (1.765 m), weight 196 lb 8 oz (89.1 kg).  Alert and oriented. Skin warm and dry. Oral mucosa is moist.   . Sclera anicteric, conjunctivae is pink. Thyroid not enlarged. No cervical lymphadenopathy. Lungs clear. Heart regular rate and rhythm.  Abdomen is soft. Bowel sounds are positive. No hepatomegaly. No abdominal masses felt. No tenderness.  No edema to lower extremities.        Assessment & Plan:  Anemia.  CBC in 2 weeks.

## 2016-09-01 NOTE — Patient Instructions (Signed)
CBC in 2 weeks. 

## 2016-09-11 ENCOUNTER — Encounter (INDEPENDENT_AMBULATORY_CARE_PROVIDER_SITE_OTHER): Payer: Self-pay

## 2016-09-13 DIAGNOSIS — H209 Unspecified iridocyclitis: Secondary | ICD-10-CM | POA: Diagnosis not present

## 2016-09-13 DIAGNOSIS — H401133 Primary open-angle glaucoma, bilateral, severe stage: Secondary | ICD-10-CM | POA: Diagnosis not present

## 2016-09-15 DIAGNOSIS — D638 Anemia in other chronic diseases classified elsewhere: Secondary | ICD-10-CM | POA: Diagnosis not present

## 2016-09-15 LAB — CBC WITH DIFFERENTIAL/PLATELET
BASOS PCT: 1 %
Basophils Absolute: 59 cells/uL (ref 0–200)
EOS ABS: 177 {cells}/uL (ref 15–500)
Eosinophils Relative: 3 %
HEMATOCRIT: 39.2 % (ref 38.5–50.0)
HEMOGLOBIN: 13.1 g/dL — AB (ref 13.2–17.1)
LYMPHS ABS: 2360 {cells}/uL (ref 850–3900)
Lymphocytes Relative: 40 %
MCH: 31.9 pg (ref 27.0–33.0)
MCHC: 33.4 g/dL (ref 32.0–36.0)
MCV: 95.4 fL (ref 80.0–100.0)
MPV: 11.3 fL (ref 7.5–12.5)
Monocytes Absolute: 531 cells/uL (ref 200–950)
Monocytes Relative: 9 %
Neutro Abs: 2773 cells/uL (ref 1500–7800)
Neutrophils Relative %: 47 %
Platelets: 210 10*3/uL (ref 140–400)
RBC: 4.11 MIL/uL — ABNORMAL LOW (ref 4.20–5.80)
RDW: 13.9 % (ref 11.0–15.0)
WBC: 5.9 10*3/uL (ref 3.8–10.8)

## 2016-09-18 ENCOUNTER — Other Ambulatory Visit (INDEPENDENT_AMBULATORY_CARE_PROVIDER_SITE_OTHER): Payer: Self-pay | Admitting: *Deleted

## 2016-09-18 DIAGNOSIS — D638 Anemia in other chronic diseases classified elsewhere: Secondary | ICD-10-CM

## 2016-10-25 ENCOUNTER — Other Ambulatory Visit (INDEPENDENT_AMBULATORY_CARE_PROVIDER_SITE_OTHER): Payer: Self-pay | Admitting: *Deleted

## 2016-10-25 ENCOUNTER — Encounter (INDEPENDENT_AMBULATORY_CARE_PROVIDER_SITE_OTHER): Payer: Self-pay | Admitting: *Deleted

## 2016-10-25 DIAGNOSIS — D638 Anemia in other chronic diseases classified elsewhere: Secondary | ICD-10-CM

## 2016-11-07 ENCOUNTER — Telehealth (INDEPENDENT_AMBULATORY_CARE_PROVIDER_SITE_OTHER): Payer: Self-pay | Admitting: Internal Medicine

## 2016-11-07 DIAGNOSIS — Z6831 Body mass index (BMI) 31.0-31.9, adult: Secondary | ICD-10-CM | POA: Diagnosis not present

## 2016-11-07 DIAGNOSIS — E6609 Other obesity due to excess calories: Secondary | ICD-10-CM | POA: Diagnosis not present

## 2016-11-07 DIAGNOSIS — Z0000 Encounter for general adult medical examination without abnormal findings: Secondary | ICD-10-CM | POA: Diagnosis not present

## 2016-11-07 DIAGNOSIS — Z1389 Encounter for screening for other disorder: Secondary | ICD-10-CM | POA: Diagnosis not present

## 2016-11-07 NOTE — Telephone Encounter (Signed)
Patient called, stated that he received a letter for labs on 11/13/16, but he's not sure they are necessary.  He stated more labs was not discussed at his visit in May.  If he does need to have these he would like to know if he needs to fast.  [phone]

## 2016-11-08 NOTE — Telephone Encounter (Signed)
Patient was called and the following message was left on his voice mail, the lab that was drawn in May, the hgb was 13.1. Terri wanted to repeat it in 8 weeks to make sure that it was going up , in the right direction that it was not dropping.

## 2016-11-14 DIAGNOSIS — D638 Anemia in other chronic diseases classified elsewhere: Secondary | ICD-10-CM | POA: Diagnosis not present

## 2016-11-15 LAB — CBC
HCT: 40.9 % (ref 38.5–50.0)
HEMOGLOBIN: 13.3 g/dL (ref 13.2–17.1)
MCH: 31.7 pg (ref 27.0–33.0)
MCHC: 32.5 g/dL (ref 32.0–36.0)
MCV: 97.6 fL (ref 80.0–100.0)
MPV: 11.6 fL (ref 7.5–12.5)
PLATELETS: 188 10*3/uL (ref 140–400)
RBC: 4.19 MIL/uL — ABNORMAL LOW (ref 4.20–5.80)
RDW: 13.9 % (ref 11.0–15.0)
WBC: 6.7 10*3/uL (ref 3.8–10.8)

## 2017-01-04 DIAGNOSIS — H401133 Primary open-angle glaucoma, bilateral, severe stage: Secondary | ICD-10-CM | POA: Diagnosis not present

## 2017-01-04 DIAGNOSIS — H00025 Hordeolum internum left lower eyelid: Secondary | ICD-10-CM | POA: Diagnosis not present

## 2017-03-06 ENCOUNTER — Ambulatory Visit (INDEPENDENT_AMBULATORY_CARE_PROVIDER_SITE_OTHER): Payer: Medicare Other | Admitting: Internal Medicine

## 2017-03-07 ENCOUNTER — Ambulatory Visit (INDEPENDENT_AMBULATORY_CARE_PROVIDER_SITE_OTHER): Payer: Medicare Other | Admitting: Internal Medicine

## 2017-03-07 ENCOUNTER — Encounter (INDEPENDENT_AMBULATORY_CARE_PROVIDER_SITE_OTHER): Payer: Self-pay | Admitting: Internal Medicine

## 2017-03-07 ENCOUNTER — Encounter (INDEPENDENT_AMBULATORY_CARE_PROVIDER_SITE_OTHER): Payer: Self-pay

## 2017-03-07 VITALS — BP 140/70 | HR 56 | Temp 98.0°F | Ht 69.0 in | Wt 199.4 lb

## 2017-03-07 DIAGNOSIS — D508 Other iron deficiency anemias: Secondary | ICD-10-CM

## 2017-03-07 NOTE — Patient Instructions (Signed)
Labs today. 3 stools card sent home with patient.

## 2017-03-07 NOTE — Progress Notes (Signed)
Subjective:    Patient ID: Kirk Taylor, male    DOB: Jan 03, 1934, 83 y.o.   MRN: [id number]  HPI  Here today for f/u. Last seen in May of this year for anemia. Stool cards in May were negative by Dr. Ethlyn Gallery. States he is not taking iron at this time. CBC in July was normal. Iron studies were normal. He has not seen any blood in his stools. He is having a BM x 1 a day.  Appetite is good. No weight loss.  No change in his stools.     CBC    Component Value Date/Time   WBC 6.7 11/14/2016 1230   RBC 4.19 (L) 11/14/2016 1230   HGB 13.3 11/14/2016 1230   HCT 40.9 11/14/2016 1230   PLT 188 11/14/2016 1230   MCV 97.6 11/14/2016 1230   MCH 31.7 11/14/2016 1230   MCHC 32.5 11/14/2016 1230   RDW 13.9 11/14/2016 1230   LYMPHSABS 2,360 09/15/2016 1113   MONOABS 531 09/15/2016 1113   EOSABS 177 09/15/2016 1113   BASOSABS 59 09/15/2016 1113     08/23/2016 H and H 11.2 and 33.4, MCV 87, ferritin 148, TIBC 240, Iron 113. % Sat 47  04/16/2017 H and H 12.3 and 36.4, MCV 92.    07/23/2013 Colonoscopy Dr. Laural Golden: Average risk: Impression:  Examination performed to cecum. Scattered diverticula at sigmoid colon. 7 mm polyp snared from distal sigmoid colon. Biopsy: Polyp was a tubular adenoma.   Review of Systems Past Medical History:  Diagnosis Date  . Glaucoma   . Hypercholesteremia   . Hypertension     Past Surgical History:  Procedure Laterality Date  . Bilateral Laser Eye Surgery    . CATARACT EXTRACTION, BILATERAL    . COLONOSCOPY    . HERNIA REPAIR Bilateral     Allergies  Allergen Reactions  . Codeine Itching    Causes burning   . Latex     Causes skin irritation and burning  . Tape     Current Outpatient Medications on File Prior to Visit  Medication Sig Dispense Refill  . ALPRAZolam (XANAX) 0.25 MG tablet Take by mouth at bedtime as needed for anxiety.    Marland Kitchen aspirin EC 81 MG tablet Take 325 mg by mouth daily.     Marland Kitchen atorvastatin (LIPITOR) 20 MG  tablet Take 10 mg by mouth daily.    . Cinnamon 500 MG capsule Take by mouth daily.    . cycloSPORINE (RESTASIS) 0.05 % ophthalmic emulsion 1 drop 2 (two) times daily.    . dorzolamide-timolol (COSOPT) 22.3-6.8 MG/ML ophthalmic solution Place 1 drop into the left eye 2 (two) times daily.    . Multiple Vitamins-Minerals (OCUVITE PRESERVISION PO) Take 1 tablet by mouth 2 (two) times daily.    Marland Kitchen NIACIN PO Take 1 tablet by mouth daily.    . valsartan (DIOVAN) 160 MG tablet Take 80 mg by mouth daily.     No current facility-administered medications on file prior to visit.         Objective:   Physical Exam Blood pressure 140/70, pulse (!) 56, temperature 98 F (36.7 C), height 5\' 9"  (1.753 m), weight 199 lb 6.4 oz (90.4 kg). Alert and oriented. Skin warm and dry. Oral mucosa is moist.   . Sclera anicteric, conjunctivae is pink. Thyroid not enlarged. No cervical lymphadenopathy. LIght wheezes heard left lung. Marland Kitchen Heart regular rate and rhythm.  Abdomen is soft. Bowel sounds are positive. No hepatomegaly. No abdominal masses  felt. No tenderness.  No edema to lower extremities.           Assessment & Plan:  IDA. Will get a CBC and iron studies. 3 stools cards sent home with patient.

## 2017-03-08 ENCOUNTER — Encounter (INDEPENDENT_AMBULATORY_CARE_PROVIDER_SITE_OTHER): Payer: Self-pay | Admitting: Internal Medicine

## 2017-03-08 LAB — CBC WITH DIFFERENTIAL/PLATELET
BASOS PCT: 0.8 %
Basophils Absolute: 67 cells/uL (ref 0–200)
EOS ABS: 277 {cells}/uL (ref 15–500)
Eosinophils Relative: 3.3 %
HEMATOCRIT: 38.2 % — AB (ref 38.5–50.0)
HEMOGLOBIN: 12.8 g/dL — AB (ref 13.2–17.1)
LYMPHS ABS: 2192 {cells}/uL (ref 850–3900)
MCH: 31.7 pg (ref 27.0–33.0)
MCHC: 33.5 g/dL (ref 32.0–36.0)
MCV: 94.6 fL (ref 80.0–100.0)
MPV: 12.1 fL (ref 7.5–12.5)
Monocytes Relative: 13.8 %
Neutro Abs: 4704 cells/uL (ref 1500–7800)
Neutrophils Relative %: 56 %
PLATELETS: 197 10*3/uL (ref 140–400)
RBC: 4.04 10*6/uL — AB (ref 4.20–5.80)
RDW: 12 % (ref 11.0–15.0)
TOTAL LYMPHOCYTE: 26.1 %
WBC: 8.4 10*3/uL (ref 3.8–10.8)
WBCMIX: 1159 {cells}/uL — AB (ref 200–950)

## 2017-03-08 LAB — IRON: Iron: 35 ug/dL — ABNORMAL LOW (ref 50–180)

## 2017-03-08 LAB — FERRITIN: Ferritin: 171 ng/mL (ref 20–380)

## 2017-03-09 ENCOUNTER — Other Ambulatory Visit (INDEPENDENT_AMBULATORY_CARE_PROVIDER_SITE_OTHER): Payer: Self-pay | Admitting: *Deleted

## 2017-03-09 DIAGNOSIS — D508 Other iron deficiency anemias: Secondary | ICD-10-CM

## 2017-03-14 ENCOUNTER — Telehealth (INDEPENDENT_AMBULATORY_CARE_PROVIDER_SITE_OTHER): Payer: Self-pay | Admitting: *Deleted

## 2017-03-14 NOTE — Telephone Encounter (Signed)
   Diagnosis:    Result(s)   Card 1:  Negative: 03/09/17    Card 2: :Negative:03/11/17   Card 3  :Negative: 03/13/17    Completed by: Shamaria Kavan LPN   HEMOCCULT SENSA DEVELOPER: LOT#:  [id number] S EXPIRATION DATE: 2020-10   HEMOCCULT SENSA CARD:  LOT#:  [id number] R EXPIRATION DATE: 05/20   CARD CONTROL RESULTS:  POSITIVE: Positive NEGATIVE: Negative    ADDITIONAL COMMENTS: Forwarded to Lelon Perla ordering Provider.

## 2017-03-15 NOTE — Telephone Encounter (Signed)
Results given to patient

## 2017-04-05 DIAGNOSIS — H401133 Primary open-angle glaucoma, bilateral, severe stage: Secondary | ICD-10-CM | POA: Diagnosis not present

## 2017-04-05 DIAGNOSIS — H3530 Unspecified macular degeneration: Secondary | ICD-10-CM | POA: Diagnosis not present

## 2017-04-05 DIAGNOSIS — H16223 Keratoconjunctivitis sicca, not specified as Sjogren's, bilateral: Secondary | ICD-10-CM | POA: Diagnosis not present

## 2017-04-27 ENCOUNTER — Encounter (INDEPENDENT_AMBULATORY_CARE_PROVIDER_SITE_OTHER): Payer: Self-pay | Admitting: *Deleted

## 2017-04-27 ENCOUNTER — Other Ambulatory Visit (INDEPENDENT_AMBULATORY_CARE_PROVIDER_SITE_OTHER): Payer: Self-pay | Admitting: *Deleted

## 2017-04-27 DIAGNOSIS — D508 Other iron deficiency anemias: Secondary | ICD-10-CM

## 2017-05-04 DIAGNOSIS — D508 Other iron deficiency anemias: Secondary | ICD-10-CM | POA: Diagnosis not present

## 2017-05-04 LAB — CBC
HEMATOCRIT: 41.8 % (ref 38.5–50.0)
HEMOGLOBIN: 14 g/dL (ref 13.2–17.1)
MCH: 31.6 pg (ref 27.0–33.0)
MCHC: 33.5 g/dL (ref 32.0–36.0)
MCV: 94.4 fL (ref 80.0–100.0)
MPV: 11.9 fL (ref 7.5–12.5)
Platelets: 202 10*3/uL (ref 140–400)
RBC: 4.43 10*6/uL (ref 4.20–5.80)
RDW: 12 % (ref 11.0–15.0)
WBC: 6.4 10*3/uL (ref 3.8–10.8)

## 2017-05-04 LAB — IRON: Iron: 182 ug/dL — ABNORMAL HIGH (ref 50–180)

## 2017-09-05 ENCOUNTER — Ambulatory Visit (INDEPENDENT_AMBULATORY_CARE_PROVIDER_SITE_OTHER): Payer: Medicare Other | Admitting: Internal Medicine

## 2017-09-05 ENCOUNTER — Encounter (INDEPENDENT_AMBULATORY_CARE_PROVIDER_SITE_OTHER): Payer: Self-pay | Admitting: Internal Medicine

## 2017-09-05 VITALS — BP 154/70 | HR 64 | Temp 97.9°F | Ht 69.5 in | Wt 200.4 lb

## 2017-09-05 DIAGNOSIS — D508 Other iron deficiency anemias: Secondary | ICD-10-CM

## 2017-09-05 NOTE — Patient Instructions (Signed)
OV in 6 months. 

## 2017-09-05 NOTE — Progress Notes (Signed)
Subjective:    Patient ID: Kirk Taylor, male    DOB: February 21, 1934, 84 y.o.   MRN: [id number]  HPI Here today for f/u. Las seen in November of 2018. Hx of IDA. Las CBC was normal.  05/04/2017 Iron 182. He is doing well.  Recent stool cards in November were negative.  03/07/2017 Ferritin 171 His appetite is good. He has gained 1 pound since his last visit in November. BMs are normal. Has a BM daily. No melena or BRRB. Continues to work part time at [employer].    Hx of C-diff x 1. CBC    Component Value Date/Time   WBC 6.4 05/04/2017 1408   RBC 4.43 05/04/2017 1408   HGB 14.0 05/04/2017 1408   HCT 41.8 05/04/2017 1408   PLT 202 05/04/2017 1408   MCV 94.4 05/04/2017 1408   MCH 31.6 05/04/2017 1408   MCHC 33.5 05/04/2017 1408   RDW 12.0 05/04/2017 1408   LYMPHSABS 2,192 03/07/2017 1534   MONOABS 531 09/15/2016 1113   EOSABS 277 03/07/2017 1534   BASOSABS 67 03/07/2017 1534     07/23/2013 Colonoscopy Dr. Laural Golden: Average risk: Impression:  Examination performed to cecum. Scattered diverticula at sigmoid colon. 7 mm polyp snared from distal sigmoid colon. Biopsy: Polyp was a tubular adenoma.     Review of Systems Past Medical History:  Diagnosis Date  . Glaucoma   . Hypercholesteremia   . Hypertension     Past Surgical History:  Procedure Laterality Date  . Bilateral Laser Eye Surgery    . CATARACT EXTRACTION, BILATERAL    . COLONOSCOPY    . COLONOSCOPY N/A 07/24/2013   Procedure: COLONOSCOPY;  Surgeon: Rogene Houston, MD;  Location: [facility];  Service: Endoscopy;  Laterality: N/A;  730  . HERNIA REPAIR Bilateral     Allergies  Allergen Reactions  . Codeine Itching    Causes burning   . Latex     Causes skin irritation and burning  . Tape     Current Outpatient Medications on File Prior to Visit  Medication Sig Dispense Refill  . ALPRAZolam (XANAX) 0.25 MG tablet Take by mouth at bedtime as needed for anxiety.    Marland Kitchen aspirin EC 81 MG  tablet Take 325 mg by mouth daily.     Marland Kitchen atorvastatin (LIPITOR) 20 MG tablet Take 10 mg by mouth daily.    . Cinnamon 500 MG capsule Take by mouth daily.    . cycloSPORINE (RESTASIS) 0.05 % ophthalmic emulsion 1 drop 2 (two) times daily.    . dorzolamide-timolol (COSOPT) 22.3-6.8 MG/ML ophthalmic solution Place 1 drop into the left eye 2 (two) times daily.    . ferrous sulfate 325 (65 FE) MG tablet Take 325 mg by mouth daily with breakfast.    . Multiple Vitamins-Minerals (OCUVITE PRESERVISION PO) Take 1 tablet by mouth 2 (two) times daily.    Marland Kitchen NIACIN PO Take 1 tablet by mouth daily.    . valsartan (DIOVAN) 160 MG tablet Take 80 mg by mouth daily.     No current facility-administered medications on file prior to visit.         Objective:   Physical Exam Blood pressure (!) 154/70, pulse 64, temperature 97.9 F (36.6 C), height 5' 9.5" (1.765 m), weight 200 lb 6.4 oz (90.9 kg). Alert and oriented. Skin warm and dry. Oral mucosa is moist.   . Sclera anicteric, conjunctivae is pink. Thyroid not enlarged. No cervical lymphadenopathy. Lungs clear. Heart  regular rate and rhythm.  Abdomen is soft. Bowel sounds are positive. No hepatomegaly. No abdominal masses felt. No tenderness.  No edema to lower extremities.           Assessment & Plan:  IDA. He is doing well. Last CBC was normal.  Will repeat CBC in June. OV in 6 months.

## 2017-09-06 DIAGNOSIS — H401133 Primary open-angle glaucoma, bilateral, severe stage: Secondary | ICD-10-CM | POA: Diagnosis not present

## 2017-09-15 DIAGNOSIS — Z6829 Body mass index (BMI) 29.0-29.9, adult: Secondary | ICD-10-CM | POA: Diagnosis not present

## 2017-09-15 DIAGNOSIS — J060 Acute laryngopharyngitis: Secondary | ICD-10-CM | POA: Diagnosis not present

## 2017-10-15 DIAGNOSIS — I10 Essential (primary) hypertension: Secondary | ICD-10-CM | POA: Diagnosis not present

## 2017-10-15 DIAGNOSIS — E6609 Other obesity due to excess calories: Secondary | ICD-10-CM | POA: Diagnosis not present

## 2017-10-15 DIAGNOSIS — F419 Anxiety disorder, unspecified: Secondary | ICD-10-CM | POA: Diagnosis not present

## 2017-10-15 DIAGNOSIS — Z1389 Encounter for screening for other disorder: Secondary | ICD-10-CM | POA: Diagnosis not present

## 2017-10-15 DIAGNOSIS — Z6831 Body mass index (BMI) 31.0-31.9, adult: Secondary | ICD-10-CM | POA: Diagnosis not present

## 2017-10-15 DIAGNOSIS — E782 Mixed hyperlipidemia: Secondary | ICD-10-CM | POA: Diagnosis not present

## 2017-10-22 ENCOUNTER — Telehealth (INDEPENDENT_AMBULATORY_CARE_PROVIDER_SITE_OTHER): Payer: Self-pay | Admitting: Internal Medicine

## 2017-10-22 DIAGNOSIS — A0472 Enterocolitis due to Clostridium difficile, not specified as recurrent: Secondary | ICD-10-CM

## 2017-10-22 NOTE — Telephone Encounter (Signed)
States has been having diarrhea for abut 2 weeks. Thought it would get better but has not.  Will get a GI pathogen. I specifically asked him if he had any other GI complaints and he said no. Hx of C-diff in 2016.

## 2017-10-23 DIAGNOSIS — A0472 Enterocolitis due to Clostridium difficile, not specified as recurrent: Secondary | ICD-10-CM | POA: Diagnosis not present

## 2017-10-25 LAB — GASTROINTESTINAL PATHOGEN PANEL PCR
C. DIFFICILE TOX A/B, PCR: NOT DETECTED
CRYPTOSPORIDIUM, PCR: NOT DETECTED
Campylobacter, PCR: NOT DETECTED
E coli (ETEC) LT/ST PCR: NOT DETECTED
E coli (STEC) stx1/stx2, PCR: NOT DETECTED
E coli 0157, PCR: NOT DETECTED
GIARDIA LAMBLIA, PCR: NOT DETECTED
Norovirus, PCR: NOT DETECTED
ROTAVIRUS, PCR: NOT DETECTED
Salmonella, PCR: NOT DETECTED
Shigella, PCR: NOT DETECTED

## 2017-12-24 DIAGNOSIS — E782 Mixed hyperlipidemia: Secondary | ICD-10-CM | POA: Diagnosis not present

## 2017-12-24 DIAGNOSIS — I10 Essential (primary) hypertension: Secondary | ICD-10-CM | POA: Diagnosis not present

## 2017-12-24 DIAGNOSIS — Z0001 Encounter for general adult medical examination with abnormal findings: Secondary | ICD-10-CM | POA: Diagnosis not present

## 2017-12-24 DIAGNOSIS — Z1389 Encounter for screening for other disorder: Secondary | ICD-10-CM | POA: Diagnosis not present

## 2017-12-24 DIAGNOSIS — E6609 Other obesity due to excess calories: Secondary | ICD-10-CM | POA: Diagnosis not present

## 2017-12-24 DIAGNOSIS — Z0000 Encounter for general adult medical examination without abnormal findings: Secondary | ICD-10-CM | POA: Diagnosis not present

## 2017-12-24 DIAGNOSIS — Z6830 Body mass index (BMI) 30.0-30.9, adult: Secondary | ICD-10-CM | POA: Diagnosis not present

## 2018-01-03 DIAGNOSIS — H401133 Primary open-angle glaucoma, bilateral, severe stage: Secondary | ICD-10-CM | POA: Diagnosis not present

## 2018-01-31 DIAGNOSIS — H401133 Primary open-angle glaucoma, bilateral, severe stage: Secondary | ICD-10-CM | POA: Diagnosis not present

## 2018-02-13 DIAGNOSIS — Z23 Encounter for immunization: Secondary | ICD-10-CM | POA: Diagnosis not present

## 2018-03-11 ENCOUNTER — Ambulatory Visit (INDEPENDENT_AMBULATORY_CARE_PROVIDER_SITE_OTHER): Payer: Medicare Other | Admitting: Internal Medicine

## 2018-03-12 ENCOUNTER — Encounter (INDEPENDENT_AMBULATORY_CARE_PROVIDER_SITE_OTHER): Payer: Self-pay | Admitting: Internal Medicine

## 2018-05-09 DIAGNOSIS — H401133 Primary open-angle glaucoma, bilateral, severe stage: Secondary | ICD-10-CM | POA: Diagnosis not present

## 2018-05-13 DIAGNOSIS — H409 Unspecified glaucoma: Secondary | ICD-10-CM | POA: Diagnosis not present

## 2018-05-13 DIAGNOSIS — H401133 Primary open-angle glaucoma, bilateral, severe stage: Secondary | ICD-10-CM | POA: Diagnosis not present

## 2018-05-14 DIAGNOSIS — H401133 Primary open-angle glaucoma, bilateral, severe stage: Secondary | ICD-10-CM | POA: Diagnosis not present

## 2018-05-21 DIAGNOSIS — H401133 Primary open-angle glaucoma, bilateral, severe stage: Secondary | ICD-10-CM | POA: Diagnosis not present

## 2018-06-20 DIAGNOSIS — H401133 Primary open-angle glaucoma, bilateral, severe stage: Secondary | ICD-10-CM | POA: Diagnosis not present

## 2018-07-18 DIAGNOSIS — H401133 Primary open-angle glaucoma, bilateral, severe stage: Secondary | ICD-10-CM | POA: Diagnosis not present

## 2018-10-22 DIAGNOSIS — H401133 Primary open-angle glaucoma, bilateral, severe stage: Secondary | ICD-10-CM | POA: Diagnosis not present

## 2018-11-21 DIAGNOSIS — H401133 Primary open-angle glaucoma, bilateral, severe stage: Secondary | ICD-10-CM | POA: Diagnosis not present

## 2018-12-03 DIAGNOSIS — H532 Diplopia: Secondary | ICD-10-CM | POA: Diagnosis not present

## 2018-12-03 DIAGNOSIS — H5213 Myopia, bilateral: Secondary | ICD-10-CM | POA: Diagnosis not present

## 2018-12-03 DIAGNOSIS — H5331 Abnormal retinal correspondence: Secondary | ICD-10-CM | POA: Diagnosis not present

## 2018-12-03 DIAGNOSIS — H409 Unspecified glaucoma: Secondary | ICD-10-CM | POA: Diagnosis not present

## 2018-12-03 DIAGNOSIS — H538 Other visual disturbances: Secondary | ICD-10-CM | POA: Diagnosis not present

## 2018-12-26 DIAGNOSIS — H401133 Primary open-angle glaucoma, bilateral, severe stage: Secondary | ICD-10-CM | POA: Diagnosis not present

## 2019-01-10 DIAGNOSIS — Z0000 Encounter for general adult medical examination without abnormal findings: Secondary | ICD-10-CM | POA: Diagnosis not present

## 2019-01-10 DIAGNOSIS — Z6830 Body mass index (BMI) 30.0-30.9, adult: Secondary | ICD-10-CM | POA: Diagnosis not present

## 2019-01-10 DIAGNOSIS — E6609 Other obesity due to excess calories: Secondary | ICD-10-CM | POA: Diagnosis not present

## 2019-01-10 DIAGNOSIS — Z23 Encounter for immunization: Secondary | ICD-10-CM | POA: Diagnosis not present

## 2019-01-10 DIAGNOSIS — Z1389 Encounter for screening for other disorder: Secondary | ICD-10-CM | POA: Diagnosis not present

## 2019-01-17 DIAGNOSIS — R7309 Other abnormal glucose: Secondary | ICD-10-CM | POA: Diagnosis not present

## 2019-01-17 DIAGNOSIS — Z6829 Body mass index (BMI) 29.0-29.9, adult: Secondary | ICD-10-CM | POA: Diagnosis not present

## 2019-01-17 DIAGNOSIS — E663 Overweight: Secondary | ICD-10-CM | POA: Diagnosis not present

## 2019-01-17 DIAGNOSIS — I10 Essential (primary) hypertension: Secondary | ICD-10-CM | POA: Diagnosis not present

## 2019-01-17 DIAGNOSIS — E7849 Other hyperlipidemia: Secondary | ICD-10-CM | POA: Diagnosis not present

## 2019-02-04 ENCOUNTER — Other Ambulatory Visit (INDEPENDENT_AMBULATORY_CARE_PROVIDER_SITE_OTHER): Payer: Self-pay | Admitting: *Deleted

## 2019-02-04 ENCOUNTER — Encounter (INDEPENDENT_AMBULATORY_CARE_PROVIDER_SITE_OTHER): Payer: Self-pay | Admitting: Internal Medicine

## 2019-02-04 ENCOUNTER — Ambulatory Visit (INDEPENDENT_AMBULATORY_CARE_PROVIDER_SITE_OTHER): Payer: Medicare Other | Admitting: Internal Medicine

## 2019-02-04 ENCOUNTER — Other Ambulatory Visit: Payer: Self-pay

## 2019-02-04 DIAGNOSIS — Z862 Personal history of diseases of the blood and blood-forming organs and certain disorders involving the immune mechanism: Secondary | ICD-10-CM | POA: Insufficient documentation

## 2019-02-04 DIAGNOSIS — Z86010 Personal history of colonic polyps: Secondary | ICD-10-CM

## 2019-02-04 DIAGNOSIS — Z1211 Encounter for screening for malignant neoplasm of colon: Secondary | ICD-10-CM

## 2019-02-04 NOTE — Patient Instructions (Signed)
Colonoscopy to be scheduled.  Please do not take iron pills until after colonoscopy.

## 2019-02-04 NOTE — Progress Notes (Signed)
Presenting complaint;  History of colonic adenoma.  Database and subjective:  Patient is 85 year old Caucasian male who was last seen in June 2019 for C. difficile colitis from which he had full recovery.  He had mild anemia and was treated with p.o. iron.  His last colonoscopy was in March 2015 with removal of 7 mm tubular adenoma from sigmoid colon.  He is here to discuss whether or not he should have colonoscopy at his age. He has no complaints.  His bowels move daily.  His appetite is very good.  He denies melena rectal bleeding nausea vomiting heartburn or dysphagia.  He has lost 14 pounds since his last visit.  He feels weight loss is voluntary.  He watches his diet and he also walks 1 mile every day on a treadmill.  He is still working part-time.  He feels he is in very good health for his age.  Current Medications: Outpatient Encounter Medications as of 02/04/2019  Medication Sig  . ALPRAZolam (XANAX) 0.25 MG tablet Take by mouth at bedtime as needed for anxiety.  Marland Kitchen aspirin EC 81 MG tablet Take 81 mg by mouth daily.   Marland Kitchen atorvastatin (LIPITOR) 20 MG tablet Take 10 mg by mouth daily.  . Cinnamon 500 MG capsule Take by mouth daily.  . cycloSPORINE (RESTASIS) 0.05 % ophthalmic emulsion 1 drop 2 (two) times daily.  . dorzolamide-timolol (COSOPT) 22.3-6.8 MG/ML ophthalmic solution Place 1 drop into the left eye 2 (two) times daily.  . Multiple Vitamins-Minerals (OCUVITE PRESERVISION PO) Take 1 tablet by mouth 2 (two) times daily.  . valsartan (DIOVAN) 160 MG tablet Take 80 mg by mouth daily.  Marland Kitchen ZIOPTAN 0.0015 % SOLN INT 1 GTT IN OU QHS  . ferrous sulfate 325 (65 FE) MG tablet Take 325 mg by mouth daily with breakfast.  . NIACIN PO Take 1 tablet by mouth daily.   No facility-administered encounter medications on file as of 02/04/2019.      Objective: Blood pressure (!) 158/70, pulse (!) 48, temperature 98 F (36.7 C), temperature source Oral, height 5\' 10"  (1.778 m), weight 186 lb 14.4  oz (84.8 kg). Patient is alert and in no acute distress. Conjunctiva is pink. Sclera is nonicteric Oropharyngeal mucosa is normal. No neck masses or thyromegaly noted. Cardiac exam with regular rhythm normal S1 and S2. No murmur or gallop noted. Lungs are clear to auscultation. Abdomen is full but soft and nontender with organomegaly or masses. No LE edema or clubbing noted.  Labs/studies Results: Lab data from 01/17/2019 WBC 5.6, H&H 14.3 and 42.0.  MCV 94 and platelet count 191K. Glucose 101, BUN 15, creatinine 0.97 Serum sodium 140, potassium 4.4, chloride 106, CO2 28 Serum calcium 9.4. Bilirubin 0.6, AP 75, AST 24, ALT 16, total protein 6.4 and albumin 4.1. Hemoglobin A1c 5.6. PSA 1.2 TSH 2.320.  Assessment:  Patient is 85 year old Caucasian male who appears to be in excellent health.  He has 7 mm tubular adenoma removed in March 2015.  He had mild anemia last year thought to be due to iron deficiency but he did not undergo any work-up.  Given history of colonic adenoma and the fact that he is in excellent health it would be reasonable to proceed with surveillance colonoscopy.  Patient feels that way and would like to proceed with colonoscopy.  Unless he has multiple polyps he may not need anymore colonoscopies.   Plan:  Surveillance colonoscopy to be scheduled later this year. Patient will stop p.o. iron for at  least 10 days prior to procedure.

## 2019-02-05 ENCOUNTER — Other Ambulatory Visit (INDEPENDENT_AMBULATORY_CARE_PROVIDER_SITE_OTHER): Payer: Self-pay | Admitting: *Deleted

## 2019-02-05 DIAGNOSIS — Z86010 Personal history of colonic polyps: Secondary | ICD-10-CM | POA: Insufficient documentation

## 2019-03-04 ENCOUNTER — Other Ambulatory Visit (INDEPENDENT_AMBULATORY_CARE_PROVIDER_SITE_OTHER): Payer: Self-pay | Admitting: *Deleted

## 2019-03-04 ENCOUNTER — Encounter (INDEPENDENT_AMBULATORY_CARE_PROVIDER_SITE_OTHER): Payer: Self-pay | Admitting: *Deleted

## 2019-03-04 ENCOUNTER — Telehealth (INDEPENDENT_AMBULATORY_CARE_PROVIDER_SITE_OTHER): Payer: Self-pay | Admitting: *Deleted

## 2019-03-04 DIAGNOSIS — Z86010 Personal history of colonic polyps: Secondary | ICD-10-CM

## 2019-03-04 MED ORDER — PEG 3350-KCL-NA BICARB-NACL 420 G PO SOLR: 4000.0000 mL | Freq: Once | ORAL | 0 refills | Status: AC

## 2019-03-04 NOTE — Telephone Encounter (Signed)
Patient needs trilyte TCS sch'd 11/30

## 2019-03-31 ENCOUNTER — Other Ambulatory Visit: Payer: Self-pay

## 2019-03-31 ENCOUNTER — Other Ambulatory Visit (HOSPITAL_COMMUNITY)
Admission: RE | Admit: 2019-03-31 | Discharge: 2019-03-31 | Disposition: A | Discharge status: Home / Self Care | Payer: Medicare Other | Source: Ambulatory Visit | Attending: Internal Medicine | Admitting: Internal Medicine

## 2019-03-31 DIAGNOSIS — E7849 Other hyperlipidemia: Secondary | ICD-10-CM | POA: Diagnosis not present

## 2019-03-31 DIAGNOSIS — Z01812 Encounter for preprocedural laboratory examination: Secondary | ICD-10-CM | POA: Diagnosis not present

## 2019-03-31 DIAGNOSIS — Z20828 Contact with and (suspected) exposure to other viral communicable diseases: Secondary | ICD-10-CM | POA: Diagnosis not present

## 2019-03-31 DIAGNOSIS — I10 Essential (primary) hypertension: Secondary | ICD-10-CM | POA: Diagnosis not present

## 2019-03-31 LAB — SARS CORONAVIRUS 2 (TAT 6-24 HRS): SARS Coronavirus 2: NEGATIVE

## 2019-04-02 ENCOUNTER — Other Ambulatory Visit: Payer: Self-pay

## 2019-04-02 ENCOUNTER — Encounter (HOSPITAL_COMMUNITY)
Admission: RE | Disposition: A | Discharge status: Home / Self Care | Payer: Self-pay | Source: Home / Self Care | Attending: Internal Medicine

## 2019-04-02 ENCOUNTER — Ambulatory Visit (HOSPITAL_COMMUNITY)
Admission: RE | Admit: 2019-04-02 | Discharge: 2019-04-02 | Disposition: A | Discharge status: Home / Self Care | Payer: Medicare Other | Attending: Internal Medicine | Admitting: Internal Medicine

## 2019-04-02 ENCOUNTER — Encounter (HOSPITAL_COMMUNITY): Payer: Self-pay | Admitting: *Deleted

## 2019-04-02 DIAGNOSIS — I10 Essential (primary) hypertension: Secondary | ICD-10-CM | POA: Insufficient documentation

## 2019-04-02 DIAGNOSIS — I4891 Unspecified atrial fibrillation: Secondary | ICD-10-CM | POA: Diagnosis not present

## 2019-04-02 DIAGNOSIS — H409 Unspecified glaucoma: Secondary | ICD-10-CM | POA: Insufficient documentation

## 2019-04-02 DIAGNOSIS — Z9841 Cataract extraction status, right eye: Secondary | ICD-10-CM | POA: Insufficient documentation

## 2019-04-02 DIAGNOSIS — E7800 Pure hypercholesterolemia, unspecified: Secondary | ICD-10-CM | POA: Diagnosis not present

## 2019-04-02 DIAGNOSIS — I4892 Unspecified atrial flutter: Secondary | ICD-10-CM | POA: Insufficient documentation

## 2019-04-02 DIAGNOSIS — Z888 Allergy status to other drugs, medicaments and biological substances status: Secondary | ICD-10-CM | POA: Insufficient documentation

## 2019-04-02 DIAGNOSIS — Z09 Encounter for follow-up examination after completed treatment for conditions other than malignant neoplasm: Secondary | ICD-10-CM | POA: Diagnosis not present

## 2019-04-02 DIAGNOSIS — Z860100 Personal history of colon polyps, unspecified: Secondary | ICD-10-CM | POA: Insufficient documentation

## 2019-04-02 DIAGNOSIS — K648 Other hemorrhoids: Secondary | ICD-10-CM | POA: Diagnosis not present

## 2019-04-02 DIAGNOSIS — Z885 Allergy status to narcotic agent status: Secondary | ICD-10-CM | POA: Insufficient documentation

## 2019-04-02 DIAGNOSIS — K5730 Diverticulosis of large intestine without perforation or abscess without bleeding: Secondary | ICD-10-CM | POA: Diagnosis not present

## 2019-04-02 DIAGNOSIS — Z1211 Encounter for screening for malignant neoplasm of colon: Secondary | ICD-10-CM | POA: Diagnosis not present

## 2019-04-02 DIAGNOSIS — Z79899 Other long term (current) drug therapy: Secondary | ICD-10-CM | POA: Diagnosis not present

## 2019-04-02 DIAGNOSIS — Z6829 Body mass index (BMI) 29.0-29.9, adult: Secondary | ICD-10-CM | POA: Diagnosis not present

## 2019-04-02 DIAGNOSIS — Z7982 Long term (current) use of aspirin: Secondary | ICD-10-CM | POA: Insufficient documentation

## 2019-04-02 DIAGNOSIS — Z9842 Cataract extraction status, left eye: Secondary | ICD-10-CM | POA: Diagnosis not present

## 2019-04-02 DIAGNOSIS — Z87891 Personal history of nicotine dependence: Secondary | ICD-10-CM | POA: Insufficient documentation

## 2019-04-02 DIAGNOSIS — Z800 Family history of malignant neoplasm of digestive organs: Secondary | ICD-10-CM | POA: Diagnosis not present

## 2019-04-02 DIAGNOSIS — Z91040 Latex allergy status: Secondary | ICD-10-CM | POA: Insufficient documentation

## 2019-04-02 DIAGNOSIS — E663 Overweight: Secondary | ICD-10-CM | POA: Diagnosis not present

## 2019-04-02 HISTORY — PX: COLONOSCOPY: SHX5424

## 2019-04-02 SURGERY — COLONOSCOPY
Anesthesia: Moderate Sedation

## 2019-04-02 MED ORDER — MIDAZOLAM HCL 5 MG/5ML IJ SOLN
INTRAMUSCULAR | Status: AC
  Filled 2019-04-02: qty 10

## 2019-04-02 MED ORDER — SODIUM CHLORIDE 0.9 % IV SOLN
INTRAVENOUS | Status: DC
  Administered 2019-04-02: 09:00:00 1000 mL via INTRAVENOUS

## 2019-04-02 MED ORDER — MEPERIDINE HCL 50 MG/ML IJ SOLN
INTRAMUSCULAR | Status: DC | PRN
  Administered 2019-04-02: 15 mg via INTRAVENOUS
  Administered 2019-04-02: 10 mg via INTRAVENOUS
  Administered 2019-04-02: 15 mg via INTRAVENOUS

## 2019-04-02 MED ORDER — MEPERIDINE HCL 50 MG/ML IJ SOLN
INTRAMUSCULAR | Status: AC
  Filled 2019-04-02: qty 1

## 2019-04-02 MED ORDER — MIDAZOLAM HCL 5 MG/5ML IJ SOLN
INTRAMUSCULAR | Status: DC | PRN
  Administered 2019-04-02: 1 mg via INTRAVENOUS
  Administered 2019-04-02 (×2): 2 mg via INTRAVENOUS

## 2019-04-02 MED ORDER — STERILE WATER FOR IRRIGATION IR SOLN
Status: DC | PRN
  Administered 2019-04-02: 1.5 mL

## 2019-04-02 NOTE — Discharge Instructions (Signed)
Resume usual medications including aspirin as before. High-fiber diet. No driving for 24 hours. Follow-up with Dr. Hilma Favors regarding new onset of atrial flutter.   Colonoscopy, Adult, Care After This sheet gives you information about how to care for yourself after your procedure. Your doctor may also give you more specific instructions. If you have problems or questions, call your doctor. What can I expect after the procedure? After the procedure, it is common to have:  A small amount of blood in your poop for 24 hours.  Some gas.  Mild cramping or bloating in your belly. Follow these instructions at home: General instructions  For the first 24 hours after the procedure: ? Do not drive or use machinery. ? Do not sign important documents. ? Do not drink alcohol. ? Do your daily activities more slowly than normal. ? Eat foods that are soft and easy to digest.  Take over-the-counter or prescription medicines only as told by your doctor. To help cramping and bloating:   Try walking around.  Put heat on your belly (abdomen) as told by your doctor. Use a heat source that your doctor recommends, such as a moist heat pack or a heating pad. ? Put a towel between your skin and the heat source. ? Leave the heat on for 20-30 minutes. ? Remove the heat if your skin turns bright red. This is especially important if you cannot feel pain, heat, or cold. You can get burned. Eating and drinking   Drink enough fluid to keep your pee (urine) clear or pale yellow.  Return to your normal diet as told by your doctor. Avoid heavy or fried foods that are hard to digest.  Avoid drinking alcohol for as long as told by your doctor. Contact a doctor if:  You have blood in your poop (stool) 2-3 days after the procedure. Get help right away if:  You have more than a small amount of blood in your poop.  You see large clumps of tissue (blood clots) in your poop.  Your belly is swollen.  You  feel sick to your stomach (nauseous).  You throw up (vomit).  You have a fever.  You have belly pain that gets worse, and medicine does not help your pain. Summary  After the procedure, it is common to have a small amount of blood in your poop. You may also have mild cramping and bloating in your belly.  For the first 24 hours after the procedure, do not drive or use machinery, do not sign important documents, and do not drink alcohol.  Get help right away if you have a lot of blood in your poop, feel sick to your stomach, have a fever, or have more belly pain. This information is not intended to replace advice given to you by your health care provider. Make sure you discuss any questions you have with your health care provider. Document Released: 05/20/2010 Document Revised: 02/15/2017 Document Reviewed: 01/10/2016 Elsevier Patient Education  2020 [employer].  Diverticulosis  Diverticulosis is a condition that develops when small pouches (diverticula) form in the wall of the large intestine (colon). The colon is where water is absorbed and stool is formed. The pouches form when the inside layer of the colon pushes through weak spots in the outer layers of the colon. You may have a few pouches or many of them. What are the causes? The cause of this condition is not known. What increases the risk? The following factors may make you more  likely to develop this condition:  Being older than age 55. Your risk for this condition increases with age. Diverticulosis is rare among people younger than age 64. By age 58, many people have it.  Eating a low-fiber diet.  Having frequent constipation.  Being overweight.  Not getting enough exercise.  Smoking.  Taking over-the-counter pain medicines, like aspirin and ibuprofen.  Having a family history of diverticulosis. What are the signs or symptoms? In most people, there are no symptoms of this condition. If you do have symptoms, they  may include:  Bloating.  Cramps in the abdomen.  Constipation or diarrhea.  Pain in the lower left side of the abdomen. How is this diagnosed? This condition is most often diagnosed during an exam for other colon problems. Because diverticulosis usually has no symptoms, it often cannot be diagnosed independently. This condition may be diagnosed by:  Using a flexible scope to examine the colon (colonoscopy).  Taking an X-ray of the colon after dye has been put into the colon (barium enema).  Doing a CT scan. How is this treated? You may not need treatment for this condition if you have never developed an infection related to diverticulosis. If you have had an infection before, treatment may include:  Eating a high-fiber diet. This may include eating more fruits, vegetables, and grains.  Taking a fiber supplement.  Taking a live bacteria supplement (probiotic).  Taking medicine to relax your colon.  Taking antibiotic medicines. Follow these instructions at home:  Drink 6-8 glasses of water or more each day to prevent constipation.  Try not to strain when you have a bowel movement.  If you have had an infection before: ? Eat more fiber as directed by your health care provider or your diet and nutrition specialist (dietitian). ? Take a fiber supplement or probiotic, if your health care provider approves.  Take over-the-counter and prescription medicines only as told by your health care provider.  If you were prescribed an antibiotic, take it as told by your health care provider. Do not stop taking the antibiotic even if you start to feel better.  Keep all follow-up visits as told by your health care provider. This is important. Contact a health care provider if:  You have pain in your abdomen.  You have bloating.  You have cramps.  You have not had a bowel movement in 3 days. Get help right away if:  Your pain gets worse.  Your bloating becomes very bad.  You  have a fever or chills, and your symptoms suddenly get worse.  You vomit.  You have bowel movements that are bloody or black.  You have bleeding from your rectum. Summary  Diverticulosis is a condition that develops when small pouches (diverticula) form in the wall of the large intestine (colon).  You may have a few pouches or many of them.  This condition is most often diagnosed during an exam for other colon problems.  If you have had an infection related to diverticulosis, treatment may include increasing the fiber in your diet, taking supplements, or taking medicines. This information is not intended to replace advice given to you by your health care provider. Make sure you discuss any questions you have with your health care provider. Document Released: 01/13/2004 Document Revised: 03/30/2017 Document Reviewed: 03/06/2016 Elsevier Patient Education  2020 [employer].  Hemorrhoids Hemorrhoids are swollen veins in and around the rectum or anus. There are two types of hemorrhoids:  Internal hemorrhoids. These occur in  the veins that are just inside the rectum. They may poke through to the outside and become irritated and painful.  External hemorrhoids. These occur in the veins that are outside the anus and can be felt as a painful swelling or hard lump near the anus. Most hemorrhoids do not cause serious problems, and they can be managed with home treatments such as diet and lifestyle changes. If home treatments do not help the symptoms, procedures can be done to shrink or remove the hemorrhoids. What are the causes? This condition is caused by increased pressure in the anal area. This pressure may result from various things, including:  Constipation.  Straining to have a bowel movement.  Diarrhea.  Pregnancy.  Obesity.  Sitting for long periods of time.  Heavy lifting or other activity that causes you to strain.  Anal sex.  Riding a bike for a long period of  time. What are the signs or symptoms? Symptoms of this condition include:  Pain.  Anal itching or irritation.  Rectal bleeding.  Leakage of stool (feces).  Anal swelling.  One or more lumps around the anus. How is this diagnosed? This condition can often be diagnosed through a visual exam. Other exams or tests may also be done, such as:  An exam that involves feeling the rectal area with a gloved hand (digital rectal exam).  An exam of the anal canal that is done using a small tube (anoscope).  A blood test, if you have lost a significant amount of blood.  A test to look inside the colon using a flexible tube with a camera on the end (sigmoidoscopy or colonoscopy). How is this treated? This condition can usually be treated at home. However, various procedures may be done if dietary changes, lifestyle changes, and other home treatments do not help your symptoms. These procedures can help make the hemorrhoids smaller or remove them completely. Some of these procedures involve surgery, and others do not. Common procedures include:  Rubber band ligation. Rubber bands are placed at the base of the hemorrhoids to cut off their blood supply.  Sclerotherapy. Medicine is injected into the hemorrhoids to shrink them.  Infrared coagulation. A type of light energy is used to get rid of the hemorrhoids.  Hemorrhoidectomy surgery. The hemorrhoids are surgically removed, and the veins that supply them are tied off.  Stapled hemorrhoidopexy surgery. The surgeon staples the base of the hemorrhoid to the rectal wall. Follow these instructions at home: Eating and drinking   Eat foods that have a lot of fiber in them, such as whole grains, beans, nuts, fruits, and vegetables.  Ask your health care provider about taking products that have added fiber (fiber supplements).  Reduce the amount of fat in your diet. You can do this by eating low-fat dairy products, eating less red meat, and  avoiding processed foods.  Drink enough fluid to keep your urine pale yellow. Managing pain and swelling   Take warm sitz baths for 20 minutes, 3-4 times a day to ease pain and discomfort. You may do this in a bathtub or using a portable sitz bath that fits over the toilet.  If directed, apply ice to the affected area. Using ice packs between sitz baths may be helpful. ? Put ice in a plastic bag. ? Place a towel between your skin and the bag. ? Leave the ice on for 20 minutes, 2-3 times a day. General instructions  Take over-the-counter and prescription medicines only as told by your  health care provider.  Use medicated creams or suppositories as told.  Get regular exercise. Ask your health care provider how much and what kind of exercise is best for you. In general, you should do moderate exercise for at least 30 minutes on most days of the week (150 minutes each week). This can include activities such as walking, biking, or yoga.  Go to the bathroom when you have the urge to have a bowel movement. Do not wait.  Avoid straining to have bowel movements.  Keep the anal area dry and clean. Use wet toilet paper or moist towelettes after a bowel movement.  Do not sit on the toilet for long periods of time. This increases blood pooling and pain.  Keep all follow-up visits as told by your health care provider. This is important. Contact a health care provider if you have:  Increasing pain and swelling that are not controlled by treatment or medicine.  Difficulty having a bowel movement, or you are unable to have a bowel movement.  Pain or inflammation outside the area of the hemorrhoids. Get help right away if you have:  Uncontrolled bleeding from your rectum. Summary  Hemorrhoids are swollen veins in and around the rectum or anus.  Most hemorrhoids can be managed with home treatments such as diet and lifestyle changes.  Taking warm sitz baths can help ease pain and  discomfort.  In severe cases, procedures or surgery can be done to shrink or remove the hemorrhoids. This information is not intended to replace advice given to you by your health care provider. Make sure you discuss any questions you have with your health care provider. Document Released: 04/14/2000 Document Revised: 04/25/2018 Document Reviewed: 09/06/2017 Elsevier Patient Education  2020 [employer].

## 2019-04-02 NOTE — Op Note (Signed)
[facility] Patient Name: Kirk Taylor Procedure Date: 04/02/2019 9:33 AM MRN: [id number] Date of Birth: 1933/10/15 Attending MD: Hildred Laser , MD CSN: [id number] Age: 85 Admit Type: Outpatient Procedure:                Colonoscopy Indications:              High risk colon cancer surveillance: Personal                            history of colonic polyps Providers:                Hildred Laser, MD, Otis Peak B. Sharon Seller, RN, Raphael Gibney, Technician Referring MD:             Halford Chessman, MD Medicines:                Meperidine 40 mg IV, Midazolam 5 mg IV Complications:            No immediate complications. Estimated Blood Loss:     Estimated blood loss: none. Procedure:                Pre-Anesthesia Assessment:                           - Prior to the procedure, a History and Physical                            was performed, and patient medications and                            allergies were reviewed. The patient's tolerance of                            previous anesthesia was also reviewed. The risks                            and benefits of the procedure and the sedation                            options and risks were discussed with the patient.                            All questions were answered, and informed consent                            was obtained. Prior Anticoagulants: The patient has                            taken no previous anticoagulant or antiplatelet                            agents except for aspirin. ASA Grade Assessment: II                            -  A patient with mild systemic disease. After                            reviewing the risks and benefits, the patient was                            deemed in satisfactory condition to undergo the                            procedure.                           After obtaining informed consent, the colonoscope                            was passed under direct  vision. Throughout the                            procedure, the patient's blood pressure, pulse, and                            oxygen saturations were monitored continuously. The                            [id number] scope was introduced through                            the anus and advanced to the the cecum, identified                            by appendiceal orifice and ileocecal valve. The                            colonoscopy was somewhat difficult due to a                            tortuous colon. Successful completion of the                            procedure was aided by withdrawing the scope and                            replacing with the 'babyscope'. The patient                            tolerated the procedure well. The quality of the                            bowel preparation was excellent. The ileocecal                            valve, appendiceal orifice, and rectum were  photographed. Scope In: 10:02:15 AM Scope Out: 10:32:03 AM Scope Withdrawal Time: 0 hours 6 minutes 54 seconds  Total Procedure Duration: 0 hours 29 minutes 48 seconds  Findings:      The perianal and digital rectal examinations were normal.      Scattered diverticula were found in the sigmoid colon.      The exam was otherwise normal throughout the examined colon.      Internal hemorrhoids were found during retroflexion. The hemorrhoids       were medium-sized. Impression:               - Diverticulosis in the sigmoid colon.                           - Internal hemorrhoids.                           - No specimens collected. Moderate Sedation:      Moderate (conscious) sedation was administered by the endoscopy nurse       and supervised by the endoscopist. The following parameters were       monitored: oxygen saturation, heart rate, blood pressure, CO2       capnography and response to care. Total physician intraservice time was       34  minutes. Recommendation:           - Patient has a contact number available for                            emergencies. The signs and symptoms of potential                            delayed complications were discussed with the                            patient. Return to normal activities tomorrow.                            Written discharge instructions were provided to the                            patient.                           - High fiber diet today.                           - Continue present medications.                           - No repeat colonoscopy due to age and the absence                            of advanced adenomas.                           - Return to Dr. Hilma Favors today for evaluation of new  onset Atrial flutter. Procedure Code(s):        --- Professional ---                           [phone], Colonoscopy, flexible; diagnostic, including                            collection of specimen(s) by brushing or washing,                            when performed (separate procedure)                           99153, Moderate sedation; each additional 15                            minutes intraservice time                           G0500, Moderate sedation services provided by the                            same physician or other qualified health care                            professional performing a gastrointestinal                            endoscopic service that sedation supports,                            requiring the presence of an independent trained                            observer to assist in the monitoring of the                            patient's level of consciousness and physiological                            status; initial 15 minutes of intra-service time;                            patient age 2 years or older (additional time may                            be reported with [phone], as appropriate) Diagnosis  Code(s):        --- Professional ---                           Z86.010, Personal history of colonic polyps                           K64.8, Other hemorrhoids  K57.30, Diverticulosis of large intestine without                            perforation or abscess without bleeding CPT copyright 2019 [employer]. All rights reserved. The codes documented in this report are preliminary and upon coder review may  be revised to meet current compliance requirements. Hildred Laser, MD Hildred Laser, MD 04/02/2019 10:42:36 AM This report has been signed electronically. Number of Addenda: 0

## 2019-04-02 NOTE — H&P (Signed)
Kirk Taylor is an 85 y.o. male.   Chief Complaint: Patient is here for colonoscopy. HPI: Patient is 85 year old Caucasian male who had 7 mm tubular adenoma removed in 2015.  He is in good health and therefore decided to proceed with 1 more colonoscopy.  He denies abdominal pain change in bowel habits shortness of breath chest pain or palpitation. Family history is negative for CRC.  Patient was noted to be in atrial flutter in preop.  It was confirmed with twelve-lead EKG.  His rate is well controlled.  There is no history of SVT.  Past Medical History:  Diagnosis Date  . Glaucoma   . Hypercholesteremia   . Hypertension     Past Surgical History:  Procedure Laterality Date  . Bilateral Laser Eye Surgery    . CATARACT EXTRACTION, BILATERAL    . COLONOSCOPY    . COLONOSCOPY N/A 07/24/2013   Procedure: COLONOSCOPY;  Surgeon: Rogene Houston, MD;  [facility];  Service: Endoscopy;  Laterality: N/A;  730  . HERNIA REPAIR Bilateral     Family History  Problem Relation Age of Onset  . Colon cancer Neg Hx    Social History:  reports that he has quit smoking. His smoking use included cigarettes. He has a 5.00 pack-year smoking history. He has never used smokeless tobacco. He reports current alcohol use of about 7.0 standard drinks of alcohol per week. He reports that he does not use drugs.  Allergies:  Allergies  Allergen Reactions  . Codeine Itching    Causes burning   . Latex Other (See Comments)    Causes skin irritation and burning  . Tape Other (See Comments)    Skin irritation (redness)/itching (with prolonged exposure) PAPER TAPE IS OK    Medications Prior to Admission  Medication Sig Dispense Refill  . ALPRAZolam (XANAX) 0.25 MG tablet Take 0.25 mg by mouth daily as needed for anxiety.     Marland Kitchen atorvastatin (LIPITOR) 20 MG tablet Take 10 mg by mouth at bedtime.     Marland Kitchen CINNAMON PO Take 1,000 mg by mouth 2 (two) times daily.    . cycloSPORINE (RESTASIS) 0.05 %  ophthalmic emulsion Place 1 drop into both eyes 2 (two) times daily.     . Dorzolamide HCl-Timolol Mal PF 2-0.5 % SOLN Place 1 drop into the left eye 2 (two) times daily. Morning & 1700    . Multiple Vitamins-Minerals (PRESERVISION AREDS 2 PO) Take 1 tablet by mouth 2 (two) times daily.    . Omega-3 Fatty Acids (FISH OIL) 1000 MG CAPS Take 2,000 mg by mouth 2 (two) times daily.    . polyethylene glycol-electrolytes (NULYTELY/GOLYTELY) 420 g solution Take 4,000 mLs by mouth once.     . valsartan (DIOVAN) 160 MG tablet Take 80 mg by mouth daily.    Marland Kitchen ZIOPTAN 0.0015 % SOLN Place 1 drop into the left eye at bedtime.     Marland Kitchen aspirin EC 81 MG tablet Take 81 mg by mouth daily.       No results found for this or any previous visit (from the past 48 hour(s)). No results found.  ROS  Blood pressure (!) 165/89, pulse 80, temperature 98.7 F (37.1 C), temperature source Axillary, resp. rate 12, height 5\' 10"  (1.778 m), weight 83.9 kg, SpO2 97 %. Physical Exam  Constitutional: He appears well-developed and well-nourished.  HENT:  Mouth/Throat: Oropharynx is clear and moist.  Eyes: Conjunctivae are normal. No scleral icterus.  Neck: No thyromegaly  present.  Cardiovascular:  Irregular rhythm normal S1 and S2.  No murmur or gallop noted.  Respiratory: Effort normal and breath sounds normal.  GI: Soft. He exhibits no distension and no mass. There is no abdominal tenderness.  Musculoskeletal:        General: No edema.  Lymphadenopathy:    He has no cervical adenopathy.  Neurological: He is alert.  Skin: Skin is warm and dry.     Assessment/Plan History of colonic adenoma. New onset of atrial flutter with variable AV block and controlled ventricular rate. Surveillance colonoscopy.  Hildred Laser, MD 04/02/2019, 9:50 AM

## 2019-04-04 ENCOUNTER — Encounter (HOSPITAL_COMMUNITY): Payer: Self-pay | Admitting: Internal Medicine

## 2019-04-17 ENCOUNTER — Telehealth: Payer: Self-pay | Admitting: Cardiology

## 2019-04-17 ENCOUNTER — Encounter: Payer: Self-pay | Admitting: Cardiology

## 2019-04-17 ENCOUNTER — Other Ambulatory Visit: Payer: Self-pay

## 2019-04-17 ENCOUNTER — Ambulatory Visit (INDEPENDENT_AMBULATORY_CARE_PROVIDER_SITE_OTHER): Payer: Medicare Other | Admitting: Cardiology

## 2019-04-17 VITALS — BP 202/85 | HR 65 | Ht 69.5 in | Wt 185.4 lb

## 2019-04-17 DIAGNOSIS — I4892 Unspecified atrial flutter: Secondary | ICD-10-CM | POA: Diagnosis not present

## 2019-04-17 DIAGNOSIS — I10 Essential (primary) hypertension: Secondary | ICD-10-CM | POA: Diagnosis not present

## 2019-04-17 NOTE — Progress Notes (Signed)
Clinical Summary [name] is a 85 y.o.male seen as new consult, referred by PA Mann for new diagnosis aflutter  1. Aflutter - new diagnosis, noted during recent endoscopy  - no significant palpitations.  - started xarelto 20mg  daily by pcp, tolerating without bleeding issues - walks 1 mile daily without troubles.    2. HTN - 120s-130s/60s - compliant with meds  SH: works at a [employer]  Past Medical History:  Diagnosis Date  . Glaucoma   . Hypercholesteremia   . Hypertension      Allergies  Allergen Reactions  . Codeine Itching    Causes burning   . Latex Other (See Comments)    Causes skin irritation and burning  . Tape Other (See Comments)    Skin irritation (redness)/itching (with prolonged exposure) PAPER TAPE IS OK     Current Outpatient Medications  Medication Sig Dispense Refill  . ALPRAZolam (XANAX) 0.25 MG tablet Take 0.25 mg by mouth daily as needed for anxiety.     Marland Kitchen aspirin EC 81 MG tablet Take 81 mg by mouth daily.     Marland Kitchen atorvastatin (LIPITOR) 20 MG tablet Take 10 mg by mouth at bedtime.     Marland Kitchen CINNAMON PO Take 1,000 mg by mouth 2 (two) times daily.    . cycloSPORINE (RESTASIS) 0.05 % ophthalmic emulsion Place 1 drop into both eyes 2 (two) times daily.     . Dorzolamide HCl-Timolol Mal PF 2-0.5 % SOLN Place 1 drop into the left eye 2 (two) times daily. Morning & 1700    . Multiple Vitamins-Minerals (PRESERVISION AREDS 2 PO) Take 1 tablet by mouth 2 (two) times daily.    . Omega-3 Fatty Acids (FISH OIL) 1000 MG CAPS Take 2,000 mg by mouth 2 (two) times daily.    . polyethylene glycol-electrolytes (NULYTELY/GOLYTELY) 420 g solution Take 4,000 mLs by mouth once.     . valsartan (DIOVAN) 160 MG tablet Take 80 mg by mouth daily.    Marland Kitchen ZIOPTAN 0.0015 % SOLN Place 1 drop into the left eye at bedtime.      No current facility-administered medications for this visit.     Past Surgical History:  Procedure Laterality Date  . Bilateral Laser Eye  Surgery    . CATARACT EXTRACTION, BILATERAL    . COLONOSCOPY    . COLONOSCOPY N/A 07/24/2013   Procedure: COLONOSCOPY;  Surgeon: Rogene Houston, MD;  [facility];  Service: Endoscopy;  Laterality: N/A;  730  . COLONOSCOPY N/A 04/02/2019   Procedure: COLONOSCOPY;  Surgeon: Rogene Houston, MD;  [facility];  Service: Endoscopy;  Laterality: N/A;  100pm  . HERNIA REPAIR Bilateral      Allergies  Allergen Reactions  . Codeine Itching    Causes burning   . Latex Other (See Comments)    Causes skin irritation and burning  . Tape Other (See Comments)    Skin irritation (redness)/itching (with prolonged exposure) PAPER TAPE IS OK      Family History  Problem Relation Age of Onset  . Colon cancer Neg Hx      Social History [name] reports that he has quit smoking. His smoking use included cigarettes. He has a 5.00 pack-year smoking history. He has never used smokeless tobacco. [name] reports current alcohol use of about 7.0 standard drinks of alcohol per week.   Review of Systems CONSTITUTIONAL: No weight loss, fever, chills, weakness or fatigue.  HEENT: Eyes: No  visual loss, blurred vision, double vision or yellow sclerae.No hearing loss, sneezing, congestion, runny nose or sore throat.  SKIN: No rash or itching.  CARDIOVASCULAR: per hpi RESPIRATORY: No shortness of breath, cough or sputum.  GASTROINTESTINAL: No anorexia, nausea, vomiting or diarrhea. No abdominal pain or blood.  GENITOURINARY: No burning on urination, no polyuria NEUROLOGICAL: No headache, dizziness, syncope, paralysis, ataxia, numbness or tingling in the extremities. No change in bowel or bladder control.  MUSCULOSKELETAL: No muscle, back pain, joint pain or stiffness.  LYMPHATICS: No enlarged nodes. No history of splenectomy.  PSYCHIATRIC: No history of depression or anxiety.  ENDOCRINOLOGIC: No reports of sweating, cold or heat intolerance. No polyuria or polydipsia.   Marland Kitchen   Physical Examination Today's Vitals   04/17/19 1605 04/17/19 1615  BP: (!) 196/80 (!) 202/85  Pulse: 74 65  SpO2: 98%   Weight: 185 lb 6.4 oz (84.1 kg)   Height: 5' 9.5" (1.765 m)    Body mass index is 26.99 kg/m.  Gen: resting comfortably, no acute distress HEENT: no scleral icterus, pupils equal round and reactive, no palptable cervical adenopathy,  CV: irreg, no m/r/g, no jvd Resp: Clear to auscultation bilaterally GI: abdomen is soft, non-tender, non-distended, normal bowel sounds, no hepatosplenomegaly MSK: extremities are warm, no edema.  Skin: warm, no rash Neuro:  no focal deficits Psych: appropriate affect      Assessment and Plan  1. Aflutter - asymptomatic, rates are actually controlled without any av nodal agents - already started on xarelto for stroke prevention CHADS2Vasc score is at least 3, continue xarelto and stop ASA - obtain echo - with self controlled heart rates and lack of symptoms do not see a strong reason to consider cardioversion.   2. HTN -manual recheck 170s/80s, home bp's are at goal that he checks regularly - monitor at this time     Arnoldo Lenis, M.D.

## 2019-04-17 NOTE — Telephone Encounter (Signed)
  Precert needed for:  Echo   Location: [facility]    Date: Apr 23, 2019

## 2019-04-17 NOTE — Patient Instructions (Signed)
Your physician recommends that you schedule a follow-up appointment in: [place] has recommended you make the following change in your medication:   [place] physician has requested that you have an echocardiogram. Echocardiography is a painless test that uses sound waves to create images of your heart. It provides your doctor with information about the size and shape of your heart and how well your heart's chambers and valves are working. This procedure takes approximately one hour. There are no restrictions for this procedure.  Thank you for choosing [facility]!!

## 2019-04-21 ENCOUNTER — Telehealth: Payer: Self-pay | Admitting: Cardiology

## 2019-04-21 NOTE — Telephone Encounter (Signed)
I will forward to Dr Bronson Ing (covering for [place])

## 2019-04-21 NOTE — Telephone Encounter (Signed)
Patient notified of Dr.Koneswaran's response

## 2019-04-21 NOTE — Telephone Encounter (Signed)
No changes to therapy for now.

## 2019-04-21 NOTE — Telephone Encounter (Signed)
Patient called with recent BP readings -   04/19/2019 BP  125/63  04/20/2019 BP 121/57 730 pm   BP 125/59 800 pm  04/21/2019 BP 150/80  800am    BP 131/70  830am

## 2019-04-23 ENCOUNTER — Other Ambulatory Visit: Payer: Self-pay

## 2019-04-23 ENCOUNTER — Ambulatory Visit (HOSPITAL_COMMUNITY)
Admission: RE | Admit: 2019-04-23 | Discharge: 2019-04-23 | Disposition: A | Discharge status: Home / Self Care | Payer: Medicare Other | Source: Ambulatory Visit | Attending: Cardiology | Admitting: Cardiology

## 2019-04-23 DIAGNOSIS — I4891 Unspecified atrial fibrillation: Secondary | ICD-10-CM | POA: Insufficient documentation

## 2019-04-23 NOTE — Progress Notes (Signed)
*  PRELIMINARY RESULTS* Echocardiogram 2D Echocardiogram has been performed.  Kirk Taylor 04/23/2019, 1:51 PM

## 2019-04-24 ENCOUNTER — Telehealth: Payer: Self-pay

## 2019-04-24 NOTE — Telephone Encounter (Signed)
-----   Message from Herminio Commons, MD sent at 04/24/2019 10:04 AM EST ----- Normal pumping function with moderate to severe aortic valve leakage.

## 2019-04-24 NOTE — Telephone Encounter (Signed)
Called pt., no answer. Left message for pt to return call.  

## 2019-04-30 ENCOUNTER — Telehealth: Payer: Self-pay

## 2019-04-30 NOTE — Telephone Encounter (Signed)
-----   Message from Herminio Commons, MD sent at 04/24/2019 10:04 AM EST ----- Normal pumping function with moderate to severe aortic valve leakage.

## 2019-04-30 NOTE — Telephone Encounter (Signed)
After many attempts to contact pt and no return call. I mailed letter.

## 2019-05-09 DIAGNOSIS — Z23 Encounter for immunization: Secondary | ICD-10-CM | POA: Diagnosis not present

## 2019-05-12 NOTE — Telephone Encounter (Signed)
Pt notified of test results

## 2019-05-12 NOTE — Telephone Encounter (Signed)
Received letter-- please call [phone]

## 2019-05-12 NOTE — Telephone Encounter (Signed)
Returned pt call. No answer. Left msg to call back.  

## 2019-05-16 ENCOUNTER — Emergency Department (HOSPITAL_COMMUNITY): Payer: Medicare Other

## 2019-05-16 ENCOUNTER — Encounter (HOSPITAL_COMMUNITY): Payer: Self-pay | Admitting: *Deleted

## 2019-05-16 ENCOUNTER — Inpatient Hospital Stay (HOSPITAL_COMMUNITY)
Admission: EM | Admit: 2019-05-16 | Discharge: 2019-06-02 | DRG: 065 | Disposition: E | Payer: Medicare Other | Attending: Internal Medicine | Admitting: Internal Medicine

## 2019-05-16 ENCOUNTER — Other Ambulatory Visit: Payer: Self-pay

## 2019-05-16 DIAGNOSIS — R404 Transient alteration of awareness: Secondary | ICD-10-CM | POA: Diagnosis not present

## 2019-05-16 DIAGNOSIS — Z91048 Other nonmedicinal substance allergy status: Secondary | ICD-10-CM

## 2019-05-16 DIAGNOSIS — Z885 Allergy status to narcotic agent status: Secondary | ICD-10-CM | POA: Diagnosis not present

## 2019-05-16 DIAGNOSIS — R519 Headache, unspecified: Secondary | ICD-10-CM | POA: Diagnosis not present

## 2019-05-16 DIAGNOSIS — R0902 Hypoxemia: Secondary | ICD-10-CM | POA: Diagnosis not present

## 2019-05-16 DIAGNOSIS — R451 Restlessness and agitation: Secondary | ICD-10-CM | POA: Diagnosis present

## 2019-05-16 DIAGNOSIS — Z79899 Other long term (current) drug therapy: Secondary | ICD-10-CM | POA: Diagnosis not present

## 2019-05-16 DIAGNOSIS — Z66 Do not resuscitate: Secondary | ICD-10-CM | POA: Diagnosis present

## 2019-05-16 DIAGNOSIS — H409 Unspecified glaucoma: Secondary | ICD-10-CM | POA: Diagnosis present

## 2019-05-16 DIAGNOSIS — I602 Nontraumatic subarachnoid hemorrhage from anterior communicating artery: Secondary | ICD-10-CM | POA: Diagnosis present

## 2019-05-16 DIAGNOSIS — Z87891 Personal history of nicotine dependence: Secondary | ICD-10-CM

## 2019-05-16 DIAGNOSIS — G919 Hydrocephalus, unspecified: Secondary | ICD-10-CM | POA: Diagnosis present

## 2019-05-16 DIAGNOSIS — R52 Pain, unspecified: Secondary | ICD-10-CM | POA: Diagnosis not present

## 2019-05-16 DIAGNOSIS — I10 Essential (primary) hypertension: Secondary | ICD-10-CM | POA: Diagnosis present

## 2019-05-16 DIAGNOSIS — Z86010 Personal history of colonic polyps: Secondary | ICD-10-CM

## 2019-05-16 DIAGNOSIS — Z91040 Latex allergy status: Secondary | ICD-10-CM

## 2019-05-16 DIAGNOSIS — Z20822 Contact with and (suspected) exposure to covid-19: Secondary | ICD-10-CM | POA: Diagnosis present

## 2019-05-16 DIAGNOSIS — E7800 Pure hypercholesterolemia, unspecified: Secondary | ICD-10-CM | POA: Diagnosis present

## 2019-05-16 DIAGNOSIS — I609 Nontraumatic subarachnoid hemorrhage, unspecified: Secondary | ICD-10-CM | POA: Diagnosis not present

## 2019-05-16 DIAGNOSIS — G4489 Other headache syndrome: Secondary | ICD-10-CM | POA: Diagnosis not present

## 2019-05-16 DIAGNOSIS — I4891 Unspecified atrial fibrillation: Secondary | ICD-10-CM | POA: Diagnosis not present

## 2019-05-16 DIAGNOSIS — Z515 Encounter for palliative care: Secondary | ICD-10-CM | POA: Diagnosis present

## 2019-05-16 DIAGNOSIS — I615 Nontraumatic intracerebral hemorrhage, intraventricular: Secondary | ICD-10-CM | POA: Diagnosis present

## 2019-05-16 DIAGNOSIS — R202 Paresthesia of skin: Secondary | ICD-10-CM | POA: Diagnosis not present

## 2019-05-16 DIAGNOSIS — I480 Paroxysmal atrial fibrillation: Secondary | ICD-10-CM | POA: Diagnosis present

## 2019-05-16 LAB — COMPREHENSIVE METABOLIC PANEL
ALT: 34 U/L (ref 0–44)
AST: 46 U/L — ABNORMAL HIGH (ref 15–41)
Albumin: 4 g/dL (ref 3.5–5.0)
Alkaline Phosphatase: 75 U/L (ref 38–126)
Anion gap: 10 (ref 5–15)
BUN: 15 mg/dL (ref 8–23)
CO2: 24 mmol/L (ref 22–32)
Calcium: 9 mg/dL (ref 8.9–10.3)
Chloride: 105 mmol/L (ref 98–111)
Creatinine, Ser: 0.92 mg/dL (ref 0.61–1.24)
GFR calc Af Amer: >60 mL/min (ref >60)
GFR calc non Af Amer: >60 mL/min (ref >60)
Glucose, Bld: 132 mg/dL — ABNORMAL HIGH (ref 70–99)
Potassium: 4.2 mmol/L (ref 3.5–5.1)
Sodium: 139 mmol/L (ref 135–145)
Total Bilirubin: 1.2 mg/dL (ref 0.3–1.2)
Total Protein: 7.7 g/dL (ref 6.5–8.1)

## 2019-05-16 LAB — RAPID URINE DRUG SCREEN, HOSP PERFORMED
Amphetamines: NOT DETECTED
Barbiturates: NOT DETECTED
Benzodiazepines: NOT DETECTED
Cocaine: NOT DETECTED
Opiates: NOT DETECTED
Tetrahydrocannabinol: NOT DETECTED

## 2019-05-16 LAB — URINALYSIS, ROUTINE W REFLEX MICROSCOPIC
Bacteria, UA: NONE SEEN
Bilirubin Urine: NEGATIVE
Glucose, UA: NEGATIVE mg/dL
Hgb urine dipstick: NEGATIVE
Ketones, ur: 5 mg/dL — AB
Leukocytes,Ua: NEGATIVE
Nitrite: NEGATIVE
Protein, ur: 100 mg/dL — AB
Specific Gravity, Urine: 1.009 (ref 1.005–1.030)
pH: 7 (ref 5.0–8.0)

## 2019-05-16 LAB — CBC
HCT: 47.3 % (ref 39.0–52.0)
Hemoglobin: 14.7 g/dL (ref 13.0–17.0)
MCH: 32 pg (ref 26.0–34.0)
MCHC: 31.1 g/dL (ref 30.0–36.0)
MCV: 103.1 fL — ABNORMAL HIGH (ref 80.0–100.0)
Platelets: 193 10*3/uL (ref 150–400)
RBC: 4.59 MIL/uL (ref 4.22–5.81)
RDW: 13.3 % (ref 11.5–15.5)
WBC: 10.2 10*3/uL (ref 4.0–10.5)
nRBC: 0 % (ref 0.0–0.2)

## 2019-05-16 LAB — RESPIRATORY PANEL BY RT PCR (FLU A&B, COVID)
Influenza A by PCR: NEGATIVE
Influenza B by PCR: NEGATIVE
SARS Coronavirus 2 by RT PCR: NEGATIVE

## 2019-05-16 LAB — DIFFERENTIAL
Abs Immature Granulocytes: 0.03 10*3/uL (ref 0.00–0.07)
Basophils Absolute: 0.1 10*3/uL (ref 0.0–0.1)
Basophils Relative: 1 %
Eosinophils Absolute: 0.1 10*3/uL (ref 0.0–0.5)
Eosinophils Relative: 1 %
Immature Granulocytes: 0 %
Lymphocytes Relative: 17 %
Lymphs Abs: 1.7 10*3/uL (ref 0.7–4.0)
Monocytes Absolute: 0.4 10*3/uL (ref 0.1–1.0)
Monocytes Relative: 4 %
Neutro Abs: 7.8 10*3/uL — ABNORMAL HIGH (ref 1.7–7.7)
Neutrophils Relative %: 77 %

## 2019-05-16 LAB — APTT: aPTT: 32 seconds (ref 24–36)

## 2019-05-16 LAB — PROTIME-INR
INR: 1.4 — ABNORMAL HIGH (ref 0.8–1.2)
Prothrombin Time: 17.3 seconds — ABNORMAL HIGH (ref 11.4–15.2)

## 2019-05-16 LAB — ETHANOL: Alcohol, Ethyl (B): <10 mg/dL (ref <10)

## 2019-05-16 MED ORDER — PROPOFOL 1000 MG/100ML IV EMUL: 5.0000 ug/kg/min | INTRAVENOUS | Status: DC

## 2019-05-16 MED ORDER — PROTHROMBIN COMPLEX CONC HUMAN 500 UNITS IV KIT: 50.0000 [IU]/kg | PACK | Status: DC

## 2019-05-16 MED ORDER — ONDANSETRON HCL 4 MG/2ML IJ SOLN: 4.0000 mg | Freq: Four times a day (QID) | INTRAMUSCULAR | Status: DC | PRN

## 2019-05-16 MED ORDER — MORPHINE SULFATE (PF) 2 MG/ML IV SOLN
1.0000 mg | INTRAVENOUS | Status: DC | PRN
  Administered 2019-05-17 – 2019-05-18 (×6): 1 mg via INTRAVENOUS
  Filled 2019-05-16 (×6): qty 1

## 2019-05-16 MED ORDER — MORPHINE SULFATE (PF) 4 MG/ML IV SOLN
4.0000 mg | Freq: Once | INTRAVENOUS | Status: AC
  Administered 2019-05-16: 18:00:00 4 mg via INTRAVENOUS
  Filled 2019-05-16: qty 1

## 2019-05-16 MED ORDER — ACETAMINOPHEN 650 MG RE SUPP: 650.0000 mg | Freq: Four times a day (QID) | RECTAL | Status: DC | PRN

## 2019-05-16 MED ORDER — NICARDIPINE HCL IN NACL 20-0.86 MG/200ML-% IV SOLN
3.0000 mg/h | INTRAVENOUS | Status: DC
  Administered 2019-05-16: 14:00:00 3 mg/h via INTRAVENOUS

## 2019-05-16 MED ORDER — SUCCINYLCHOLINE CHLORIDE 20 MG/ML IJ SOLN
INTRAMUSCULAR | Status: AC | PRN
  Administered 2019-05-16: 150 mg via INTRAVENOUS

## 2019-05-16 MED ORDER — LORAZEPAM 2 MG/ML IJ SOLN: 1.0000 mg | INTRAMUSCULAR | Status: DC | PRN

## 2019-05-16 MED ORDER — ACETAMINOPHEN 325 MG PO TABS: 650.0000 mg | ORAL_TABLET | Freq: Four times a day (QID) | ORAL | Status: DC | PRN

## 2019-05-16 MED ORDER — PROPOFOL 1000 MG/100ML IV EMUL
INTRAVENOUS | Status: AC
  Administered 2019-05-16: 5 ug/kg/min via INTRAVENOUS
  Filled 2019-05-16: qty 100

## 2019-05-16 MED ORDER — ETOMIDATE 2 MG/ML IV SOLN
INTRAVENOUS | Status: AC | PRN
  Administered 2019-05-16: 20 mg via INTRAVENOUS

## 2019-05-16 NOTE — H&P (Signed)
TRH H&P    Patient Demographics:    Kirk Taylor, is a 86 y.o. male  MRN: [id number]  DOB - 16-Oct-1933  Admit Date - 06/01/2019  Referring MD/NP/PA: Catalina Pizza  Outpatient Primary MD for the patient is Sharilyn Sites, MD  Patient coming from: home  Chief complaint-  headache   HPI:    Kirk Taylor  is a 86 y.o. male,  w hypertension, hyperlipidemia, Pafib, apparently presents with c/o headache.  Upon arrival to ED, pt had AMS.   In ED T 98.2, P 58,  Bp 212/73  Pox 98% on RA Wt 77.1kg  CT brain IMPRESSION: 1. Very large volume subarachnoid hemorrhage. Pattern most compatible with aneurysm hemorrhage. 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 Intraventricular hemorrhage with mild hydrocephalus. Mild midline shift to the right. 2. CTA head recommended to evaluate for aneurysm. 3. These results were called by telephone at the time of interpretation on 06/01/2019 at 1:57 pm to provider Veryl Speak , who verbally acknowledged these results.  CXR IMPRESSION: Tube positions as described without pneumothorax. Note that the endotracheal tube is immediately proximal to the carina. Advise withdrawing endotracheal tube approximately 3 cm.  Lungs clear. Heart mildly enlarged. No adenopathy. Apparent superior migration of the right humeral head, a finding indicative of chronic rotator cuff tear on the right.  Wbc 10.2, Hgb 14.7, Plt 193 Na 139, K 4.2, Bun 15, Creatinine 0.92 Ast 46, Alt 34 INR 1.4 ETOH <10,  Urinalysis wbc 0-5, Rbc 6-10 UDS negative  Pt initially intubated.  ED discussed case w Dr Arnoldo Morale who thought that the patient would not benefit from sugery.  Per ED, family wants to make the patient CMO     Review of systems:    In addition to the HPI above, pt is unable to provide ROM due to [facility]  No Fever-chills, No Headache, No changes with Vision or hearing, No  problems swallowing food or Liquids, No Chest pain, Cough or Shortness of Breath, No Abdominal pain, No Nausea or Vomiting, bowel movements are regular, No Blood in stool or Urine, No dysuria, No new skin rashes or bruises, No new joints pains-aches,  No new weakness, tingling, numbness in any extremity, No recent weight gain or loss, No polyuria, polydypsia or polyphagia, No significant Mental Stressors.  All other systems reviewed and are negative.    Past History of the following :    Past Medical History:  Diagnosis Date  . Glaucoma   . Hypercholesteremia   . Hypertension       Past Surgical History:  Procedure Laterality Date  . Bilateral Laser Eye Surgery    . CATARACT EXTRACTION, BILATERAL    . COLONOSCOPY    . COLONOSCOPY N/A 07/24/2013   Procedure: COLONOSCOPY;  Surgeon: Rogene Houston, MD;  [facility];  Service: Endoscopy;  Laterality: N/A;  730  . COLONOSCOPY N/A 04/02/2019   Procedure: COLONOSCOPY;  Surgeon: Rogene Houston, MD;  Location: [facility];  Service: Endoscopy;  Laterality: N/A;  100pm  . HERNIA REPAIR  Bilateral       Social History:      Social History   Tobacco Use  . Smoking status: Former Smoker    Packs/day: 1.00    Years: 5.00    Pack years: 5.00    Types: Cigarettes    Quit date: 05/01/1968    Years since quitting: 51.0  . Smokeless tobacco: Never Used  Substance Use Topics  . Alcohol use: Yes    Alcohol/week: 7.0 standard drinks    Types: 7 Standard drinks or equivalent per week    Comment: occasionally       Family History :     Family History  Problem Relation Age of Onset  . Colon cancer Neg Hx        Home Medications:   Prior to Admission medications   Medication Sig Start Date End Date Taking? Authorizing Provider  ALPRAZolam Duanne Moron) 0.25 MG tablet Take 0.25 mg by mouth daily as needed for anxiety.     [provider]  atorvastatin (LIPITOR) 20 MG tablet Take 10 mg by mouth at  bedtime.     [provider]  CINNAMON PO Take 1,000 mg by mouth 2 (two) times daily.    [provider]  cycloSPORINE (RESTASIS) 0.05 % ophthalmic emulsion Place 1 drop into both eyes 2 (two) times daily.     [provider]  Dorzolamide HCl-Timolol Mal PF 2-0.5 % SOLN Place 1 drop into the left eye 2 (two) times daily. Morning & 1700 02/06/19   [provider]  Multiple Vitamins-Minerals (PRESERVISION AREDS 2 PO) Take 1 tablet by mouth 2 (two) times daily.    [provider]  Omega-3 Fatty Acids (FISH OIL) 1000 MG CAPS Take 2,000 mg by mouth 2 (two) times daily.    [provider]  rivaroxaban (XARELTO) 20 MG TABS tablet Take 20 mg by mouth daily with supper.    [provider]  valsartan (DIOVAN) 160 MG tablet Take 80 mg by mouth daily.    [provider]  ZIOPTAN 0.0015 % SOLN Place 1 drop into the left eye at bedtime.  11/05/18   [provider]     Allergies:     Allergies  Allergen Reactions  . Codeine Itching    Causes burning   . Latex Other (See Comments)    Causes skin irritation and burning  . Tape Other (See Comments)    Skin irritation (redness)/itching (with prolonged exposure) PAPER TAPE IS OK     Physical Exam:   Vitals  Blood pressure (!) 199/78, pulse 88, temperature (!) 100.6 F (38.1 C), resp. rate 19, height 5\' 9"  (1.753 m), weight 77.1 kg, SpO2 96 %.  1.  General: Pt unresponsive  2. Psychiatric: Unable to assesss  3. Neurologic: Pt barely responds to painful stimuli  4. HEENMT:  Anicteric, pupils 64mm , fixed Neck: no jvd  5. Respiratory : CTAB  6. Cardiovascular : Irr, irr, s1, s2,    7. Gastrointestinal:  Abd: soft, nt, nd, +bs  8. Skin:  Ext: no c/c/e, no rash  9.Musculoskeletal:  Good ROM    Data Review:    CBC Recent Labs  Lab 05/23/2019 1415  WBC 10.2  HGB 14.7  HCT 47.3  PLT 193  MCV 103.1*  MCH 32.0  MCHC 31.1  RDW 13.3  LYMPHSABS 1.7   MONOABS 0.4  EOSABS 0.1  BASOSABS 0.1   ------------------------------------------------------------------------------------------------------------------  Results for orders placed or performed during the hospital encounter  of 05/24/2019 (from the past 48 hour(s))  Urine rapid drug screen (hosp performed)     Status: None   Collection Time: 05/10/2019  1:40 PM  Result Value Ref Range   Opiates NONE DETECTED NONE DETECTED   Cocaine NONE DETECTED NONE DETECTED   Benzodiazepines NONE DETECTED NONE DETECTED   Amphetamines NONE DETECTED NONE DETECTED   Tetrahydrocannabinol NONE DETECTED NONE DETECTED   Barbiturates NONE DETECTED NONE DETECTED    Comment: (NOTE) DRUG SCREEN FOR MEDICAL PURPOSES ONLY.  IF CONFIRMATION IS NEEDED FOR ANY PURPOSE, NOTIFY LAB WITHIN 5 DAYS. LOWEST DETECTABLE LIMITS FOR URINE DRUG SCREEN Drug Class                     Cutoff (ng/mL) Amphetamine and metabolites    1000 Barbiturate and metabolites    200 Benzodiazepine                 [id number] Tricyclics and metabolites     300 Opiates and metabolites        300 Cocaine and metabolites        300 THC                            50 Performed at [facility], [address], [place], [place] [address]   Urinalysis, Routine w reflex microscopic     Status: Abnormal   Collection Time: 05/30/2019  1:40 PM  Result Value Ref Range   Color, Urine YELLOW YELLOW   APPearance CLEAR CLEAR   Specific Gravity, Urine 1.009 1.005 - 1.030   pH 7.0 5.0 - 8.0   Glucose, UA NEGATIVE NEGATIVE mg/dL   Hgb urine dipstick NEGATIVE NEGATIVE   Bilirubin Urine NEGATIVE NEGATIVE   Ketones, ur 5 (A) NEGATIVE mg/dL   Protein, ur 100 (A) NEGATIVE mg/dL   Nitrite NEGATIVE NEGATIVE   Leukocytes,Ua NEGATIVE NEGATIVE   RBC / HPF 6-10 0 - 5 RBC/hpf   WBC, UA 0-5 0 - 5 WBC/hpf   Bacteria, UA NONE SEEN NONE SEEN   Mucus PRESENT    Hyaline Casts, UA PRESENT     Comment: Performed at [facility], [address], [place], [place] [address]   Respiratory Panel by RT PCR (Flu A&B, Covid) - Nasopharyngeal Swab     Status: None   Collection Time: 05/17/2019  1:52 PM   Specimen: Nasopharyngeal Swab  Result Value Ref Range   SARS Coronavirus 2 by RT PCR NEGATIVE NEGATIVE    Comment: (NOTE) SARS-CoV-2 target nucleic acids are NOT DETECTED. The SARS-CoV-2 RNA is generally detectable in upper respiratoy specimens during the acute phase of infection. The lowest concentration of SARS-CoV-2 viral copies this assay can detect is 131 copies/mL. A negative result does not preclude SARS-Cov-2 infection and should not be used as the sole basis for treatment or other patient management decisions. A negative result may occur with  improper specimen collection/handling, submission of specimen other than nasopharyngeal swab, presence of viral mutation(s) within the areas targeted by this assay, and inadequate number of viral copies (<131 copies/mL). A negative result must be combined with clinical observations, patient history, and epidemiological information. The expected result is Negative. Fact Sheet for Patients:  PinkCheek.be Fact Sheet for Healthcare Providers:  GravelBags.it This test is not yet ap proved or cleared by the [place] FDA and  has been authorized for detection and/or diagnosis of SARS-CoV-2 by FDA under an Emergency Use Authorization (EUA). This  EUA will remain  in effect (meaning this test can be used) for the duration of the COVID-19 declaration under Section 564(b)(1) of the Act, 21 U.S.C. section 360bbb-3(b)(1), unless the authorization is terminated or revoked sooner.    Influenza A by PCR NEGATIVE NEGATIVE   Influenza B by PCR NEGATIVE NEGATIVE    Comment: (NOTE) The Xpert Xpress SARS-CoV-2/FLU/RSV assay is intended as an aid in  the diagnosis of influenza from Nasopharyngeal swab specimens and  should not be used as a sole basis for treatment. Nasal  washings and  aspirates are unacceptable for Xpert Xpress SARS-CoV-2/FLU/RSV  testing. Fact Sheet for Patients: PinkCheek.be Fact Sheet for Healthcare Providers: GravelBags.it This test is not yet approved or cleared by the [place] FDA and  has been authorized for detection and/or diagnosis of SARS-CoV-2 by  FDA under an Emergency Use Authorization (EUA). This EUA will remain  in effect (meaning this test can be used) for the duration of the  Covid-19 declaration under Section 564(b)(1) of the Act, 21  U.S.C. section 360bbb-3(b)(1), unless the authorization is  terminated or revoked. Performed at [facility], [address] [place], [place] [address]   Ethanol     Status: None   Collection Time: 05/10/2019  2:15 PM  Result Value Ref Range   Alcohol, Ethyl (B) <10 <10 mg/dL    Comment: (NOTE) Lowest detectable limit for serum alcohol is 10 mg/dL. For medical purposes only. Performed at [facility], [address] [place], [place] [address]   Protime-INR     Status: Abnormal   Collection Time: 05/08/2019  2:15 PM  Result Value Ref Range   Prothrombin Time 17.3 (H) 11.4 - 15.2 seconds   INR 1.4 (H) 0.8 - 1.2    Comment: (NOTE) INR goal varies based on device and disease states. Performed at [facility], [address], [place], [place] [address]   APTT     Status: None   Collection Time: 05/06/2019  2:15 PM  Result Value Ref Range   aPTT 32 24 - 36 seconds    Comment: Performed at [facility], [address] [place], [place] [address]  CBC     Status: Abnormal   Collection Time: 05/07/2019  2:15 PM  Result Value Ref Range   WBC 10.2 4.0 - 10.5 K/uL   RBC 4.59 4.22 - 5.81 MIL/uL   Hemoglobin 14.7 13.0 - 17.0 g/dL   HCT 47.3 39.0 - 52.0 %   MCV 103.1 (H) 80.0 - 100.0 fL   MCH 32.0 26.0 - 34.0 pg   MCHC 31.1 30.0 - 36.0 g/dL   RDW 13.3 11.5 - 15.5 %   Platelets 193 150 - 400 K/uL   nRBC 0.0 0.0 - 0.2 %    Comment:  Performed at [facility], [address], [place], [place] [address]  Differential     Status: Abnormal   Collection Time: 05/13/2019  2:15 PM  Result Value Ref Range   Neutrophils Relative % 77 %   Neutro Abs 7.8 (H) 1.7 - 7.7 K/uL   Lymphocytes Relative 17 %   Lymphs Abs 1.7 0.7 - 4.0 K/uL   Monocytes Relative 4 %   Monocytes Absolute 0.4 0.1 - 1.0 K/uL   Eosinophils Relative 1 %   Eosinophils Absolute 0.1 0.0 - 0.5 K/uL   Basophils Relative 1 %   Basophils Absolute 0.1 0.0 - 0.1 K/uL   Immature Granulocytes 0 %   Abs Immature Granulocytes 0.03 0.00 - 0.07  K/uL    Comment: Performed at [facility], [address] [place], [place] [address]  Comprehensive metabolic panel     Status: Abnormal   Collection Time: 05/24/2019  2:15 PM  Result Value Ref Range   Sodium 139 135 - 145 mmol/L   Potassium 4.2 3.5 - 5.1 mmol/L   Chloride 105 98 - 111 mmol/L   CO2 24 22 - 32 mmol/L   Glucose, Bld 132 (H) 70 - 99 mg/dL   BUN 15 8 - 23 mg/dL   Creatinine, Ser 0.92 0.61 - 1.24 mg/dL   Calcium 9.0 8.9 - 10.3 mg/dL   Total Protein 7.7 6.5 - 8.1 g/dL   Albumin 4.0 3.5 - 5.0 g/dL   AST 46 (H) 15 - 41 U/L   ALT 34 0 - 44 U/L   Alkaline Phosphatase 75 38 - 126 U/L   Total Bilirubin 1.2 0.3 - 1.2 mg/dL   GFR calc non Af Amer >60 >60 mL/min   GFR calc Af Amer >60 >60 mL/min   Anion gap 10 5 - 15    Comment: Performed at [facility], [address] [place], [place] [address]    Chemistries  Recent Labs  Lab 05/22/2019 1415  NA 139  K 4.2  CL 105  CO2 24  GLUCOSE 132*  BUN 15  CREATININE 0.92  CALCIUM 9.0  AST 46*  ALT 34  ALKPHOS 75  BILITOT 1.2   ------------------------------------------------------------------------------------------------------------------  ------------------------------------------------------------------------------------------------------------------ GFR: Estimated Creatinine Clearance: 59.8 mL/min (by C-G formula based on SCr of 0.92 mg/dL). Liver Function  Tests: Recent Labs  Lab 05/22/2019 1415  AST 46*  ALT 34  ALKPHOS 75  BILITOT 1.2  PROT 7.7  ALBUMIN 4.0   No results for input(s): LIPASE, AMYLASE in the last 168 hours. No results for input(s): AMMONIA in the last 168 hours. Coagulation Profile: Recent Labs  Lab 05/17/2019 1415  INR 1.4*   Cardiac Enzymes: No results for input(s): CKTOTAL, CKMB, CKMBINDEX, TROPONINI in the last 168 hours. BNP (last 3 results) No results for input(s): PROBNP in the last 8760 hours. HbA1C: No results for input(s): HGBA1C in the last 72 hours. CBG: No results for input(s): GLUCAP in the last 168 hours. Lipid Profile: No results for input(s): CHOL, HDL, LDLCALC, TRIG, CHOLHDL, LDLDIRECT in the last 72 hours. Thyroid Function Tests: No results for input(s): TSH, T4TOTAL, FREET4, T3FREE, THYROIDAB in the last 72 hours. Anemia Panel: No results for input(s): VITAMINB12, FOLATE, FERRITIN, TIBC, IRON, RETICCTPCT in the last 72 hours.  --------------------------------------------------------------------------------------------------------------- Urine analysis:    Component Value Date/Time   COLORURINE YELLOW 05/22/2019 1340   APPEARANCEUR CLEAR 05/27/2019 1340   LABSPEC 1.009 05/29/2019 1340   PHURINE 7.0 05/26/2019 1340   GLUCOSEU NEGATIVE 05/19/2019 1340   HGBUR NEGATIVE 05/15/2019 1340   BILIRUBINUR NEGATIVE 05/15/2019 1340   KETONESUR 5 (A) 05/03/2019 1340   PROTEINUR 100 (A) 05/15/2019 1340   NITRITE NEGATIVE 05/23/2019 1340   LEUKOCYTESUR NEGATIVE 05/05/2019 1340      Imaging Results:    DG Chest Portable 1 View  Result Date: 05/09/2019 CLINICAL DATA:  Hypoxia EXAM: PORTABLE CHEST 1 VIEW COMPARISON:  None. FINDINGS: Endotracheal tube tip is 7 mm above the carina. Nasogastric tube tip and side port are below the diaphragm. No pneumothorax. Lungs are clear. Heart is mildly enlarged with pulmonary vascularity normal. No adenopathy. There is aortic atherosclerosis. There is arthropathy  in the right shoulder with superior migration of the right humeral head. IMPRESSION: Tube positions  as described without pneumothorax. Note that the endotracheal tube is immediately proximal to the carina. Advise withdrawing endotracheal tube approximately 3 cm. Lungs clear. Heart mildly enlarged. No adenopathy. Apparent superior migration of the right humeral head, a finding indicative of chronic rotator cuff tear on the right. Electronically Signed   By: Lowella Grip III M.D.   On: 05/08/2019 14:42   CT HEAD CODE STROKE WO CONTRAST  Result Date: 05/27/2019 CLINICAL DATA:  Code stroke.  Headache.  Now unresponsive EXAM: CT HEAD WITHOUT CONTRAST TECHNIQUE: Contiguous axial images were obtained from the base of the skull through the vertex without intravenous contrast. COMPARISON:  None. FINDINGS: Brain: Large volume subarachnoid hemorrhage. Diffuse bilateral hemorrhage. Hemorrhage most prominent in the suprasellar cistern and interhemispheric fissure anteriorly. There is also blood in both sylvian fissures and around the brainstem. There is moderate intraventricular hemorrhage. 4 mm midline shift to the right. Mild hydrocephalus. No acute infarct or mass lesion. Vascular: Suspect aneurysmal type hemorrhage. Skull: No focal skull lesion Sinuses/Orbits: Paranasal sinuses clear.  Bilateral ocular surgery Other: None IMPRESSION: 1. Very large volume subarachnoid hemorrhage. Pattern most compatible with aneurysm hemorrhage. 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 Intraventricular hemorrhage with mild hydrocephalus. Mild midline shift to the right. 2. CTA head recommended to evaluate for aneurysm. 3. These results were called by telephone at the time of interpretation on 06/01/2019 at 1:57 pm to provider Veryl Speak , who verbally acknowledged these results. Electronically Signed   By: Franchot Gallo M.D.   On: 05/13/2019 13:58       Assessment & Plan:    Principal Problem:   Subarachnoid  bleed ([place]) Active Problems:   High cholesterol   High blood pressure  Subarachnoid hemorrhage w midline shift NPO Neurosurgery consulted by ED, was not thought to be a candidate for surgical intervention CMO per family Morphine 1mg  iv q2h, prn  Ativan 1mg  iv q2h prn    DVT Prophylaxis-   SCDs   AM Labs Ordered, also please review Full Orders  Family Communication: Admission, patients condition and plan of care including tests being ordered have been discussed with the patient  who indicate understanding and agree with the plan and Code Status.  Code Status:  DNR, CMO per family   Admission status: Inpatient: Based on patients clinical presentation and evaluation of above clinical data, I have made determination that patient meets Inpatient criteria at this time.  Pt is critically ill with subarachnoid hemorrhage with midline shift  Time spent in minutes : 55 minutes   Jani Gravel M.D on 05/26/2019 at 7:38 PM

## 2019-05-16 NOTE — ED Notes (Signed)
Family at bedside. 

## 2019-05-16 NOTE — Progress Notes (Signed)
Visited with patient, patient's son Tayvion and sister [place]. Patient was not able to communicate during visit because he was intubated. Provided spiritual and emotional support and family expressed concern for prayer. Provided comfort cart to the family.

## 2019-05-16 NOTE — ED Provider Notes (Addendum)
[facility] EMERGENCY DEPARTMENT Provider Note   CSN: [id number] Arrival date & time: 05/07/2019  1329     History Chief Complaint  Patient presents with  . Headache    Kirk Taylor is a 86 y.o. male.  Patient is an 86 year old male with history of hypertension, hypercholesterolemia presenting with complaints of headache.  This began earlier this week.  Patient was transported here by EMS.  He was initially alert and responsive, however shortly after arrival to the ER became unresponsive.  Patient currently taking Xarelto.  The history is provided by the patient.       Past Medical History:  Diagnosis Date  . Glaucoma   . Hypercholesteremia   . Hypertension     Patient Active Problem List   Diagnosis Date Noted  . History of colonic polyps 02/05/2019  . Encounter for colonoscopy due to history of adenomatous colonic polyps 02/04/2019  . History of iron deficiency anemia 02/04/2019  . Enteritis due to Clostridium difficile 09/24/2014  . High cholesterol 09/24/2014  . High blood pressure 09/24/2014  . Glaucoma 09/24/2014    Past Surgical History:  Procedure Laterality Date  . Bilateral Laser Eye Surgery    . CATARACT EXTRACTION, BILATERAL    . COLONOSCOPY    . COLONOSCOPY N/A 07/24/2013   Procedure: COLONOSCOPY;  Surgeon: Rogene Houston, MD;  Location: [facility];  Service: Endoscopy;  Laterality: N/A;  730  . COLONOSCOPY N/A 04/02/2019   Procedure: COLONOSCOPY;  Surgeon: Rogene Houston, MD;  Location: [facility];  Service: Endoscopy;  Laterality: N/A;  100pm  . HERNIA REPAIR Bilateral        Family History  Problem Relation Age of Onset  . Colon cancer Neg Hx     Social History   Tobacco Use  . Smoking status: Former Smoker    Packs/day: 1.00    Years: 5.00    Pack years: 5.00    Types: Cigarettes    Quit date: 05/01/1968    Years since quitting: 51.0  . Smokeless tobacco: Never Used  Substance Use Topics  . Alcohol use: Yes   Alcohol/week: 7.0 standard drinks    Types: 7 Standard drinks or equivalent per week    Comment: occasionally  . Drug use: No    Home Medications Prior to Admission medications   Medication Sig Start Date End Date Taking? Authorizing Provider  ALPRAZolam Duanne Moron) 0.25 MG tablet Take 0.25 mg by mouth daily as needed for anxiety.     [provider]  atorvastatin (LIPITOR) 20 MG tablet Take 10 mg by mouth at bedtime.     [provider]  CINNAMON PO Take 1,000 mg by mouth 2 (two) times daily.    [provider]  cycloSPORINE (RESTASIS) 0.05 % ophthalmic emulsion Place 1 drop into both eyes 2 (two) times daily.     [provider]  Dorzolamide HCl-Timolol Mal PF 2-0.5 % SOLN Place 1 drop into the left eye 2 (two) times daily. Morning & 1700 02/06/19   [provider]  Multiple Vitamins-Minerals (PRESERVISION AREDS 2 PO) Take 1 tablet by mouth 2 (two) times daily.    [provider]  Omega-3 Fatty Acids (FISH OIL) 1000 MG CAPS Take 2,000 mg by mouth 2 (two) times daily.    [provider]  rivaroxaban (XARELTO) 20 MG TABS tablet Take 20 mg by mouth daily with supper.    [provider]  valsartan (DIOVAN) 160 MG tablet Take 80 mg  by mouth daily.    [provider]  ZIOPTAN 0.0015 % SOLN Place 1 drop into the left eye at bedtime.  11/05/18   [provider]    Allergies    Codeine, Latex, and Tape  Review of Systems   Review of Systems  Unable to perform ROS: Acuity of condition    Physical Exam Updated Vital Signs BP (!) 150/95   Pulse 81   Temp 99.1 F (37.3 C)   Resp (!) 21   Ht 5\' 9"  (1.753 m)   Wt 77.1 kg   SpO2 100%   BMI 25.10 kg/m   Physical Exam Vitals and nursing note reviewed.  HENT:     Head: Normocephalic and atraumatic.  Cardiovascular:     Rate and Rhythm: Normal rate and regular rhythm.     Heart sounds: No murmur. No friction rub.  Pulmonary:     Effort: Pulmonary effort  is normal. No respiratory distress.     Breath sounds: Normal breath sounds. No wheezing or rales.  Abdominal:     General: Bowel sounds are normal. There is no distension.     Palpations: Abdomen is soft.     Tenderness: There is no abdominal tenderness.  Musculoskeletal:        General: Normal range of motion.     Cervical back: Normal range of motion and neck supple.  Skin:    General: Skin is warm and dry.  Neurological:     Mental Status: He is oriented to person, place, and time.     Comments: Upon my exam, patient with GCS of 5.  He is not opening his eyes, not making any attempt to speak, and having posturing with his arms.     ED Results / Procedures / Treatments   Labs (all labs ordered are listed, but only abnormal results are displayed) Labs Reviewed  PROTIME-INR - Abnormal; Notable for the following components:      Result Value   Prothrombin Time 17.3 (*)    INR 1.4 (*)    All other components within normal limits  CBC - Abnormal; Notable for the following components:   MCV 103.1 (*)    All other components within normal limits  DIFFERENTIAL - Abnormal; Notable for the following components:   Neutro Abs 7.8 (*)    All other components within normal limits  COMPREHENSIVE METABOLIC PANEL - Abnormal; Notable for the following components:   Glucose, Bld 132 (*)    AST 46 (*)    All other components within normal limits  URINALYSIS, ROUTINE W REFLEX MICROSCOPIC - Abnormal; Notable for the following components:   Ketones, ur 5 (*)    Protein, ur 100 (*)    All other components within normal limits  RESPIRATORY PANEL BY RT PCR (FLU A&B, COVID)  ETHANOL  APTT  RAPID URINE DRUG SCREEN, HOSP PERFORMED  I-STAT CHEM 8, ED    EKG EKG Interpretation  Date/Time:  Friday May 16 2019 13:58:58 EST Ventricular Rate:  66 PR Interval:    QRS Duration: 94 QT Interval:  444 QTC Calculation: 465 R Axis:   26 Text Interpretation: Atrial fibrillation with premature  ventricular or aberrantly conducted complexes Incomplete right bundle branch block Possible Anterior infarct , age undetermined Abnormal ECG Confirmed by Veryl Speak [phone]) on 05/06/2019 4:02:53 PM   Radiology DG Chest Portable 1 View  Result Date: 05/03/2019 CLINICAL DATA:  Hypoxia EXAM: PORTABLE CHEST 1 VIEW COMPARISON:  None. FINDINGS: Endotracheal tube tip  is 7 mm above the carina. Nasogastric tube tip and side port are below the diaphragm. No pneumothorax. Lungs are clear. Heart is mildly enlarged with pulmonary vascularity normal. No adenopathy. There is aortic atherosclerosis. There is arthropathy in the right shoulder with superior migration of the right humeral head. IMPRESSION: Tube positions as described without pneumothorax. Note that the endotracheal tube is immediately proximal to the carina. Advise withdrawing endotracheal tube approximately 3 cm. Lungs clear. Heart mildly enlarged. No adenopathy. Apparent superior migration of the right humeral head, a finding indicative of chronic rotator cuff tear on the right. Electronically Signed   By: Lowella Grip III M.D.   On: 05/03/2019 14:42   CT HEAD CODE STROKE WO CONTRAST  Result Date: 05/26/2019 CLINICAL DATA:  Code stroke.  Headache.  Now unresponsive EXAM: CT HEAD WITHOUT CONTRAST TECHNIQUE: Contiguous axial images were obtained from the base of the skull through the vertex without intravenous contrast. COMPARISON:  None. FINDINGS: Brain: Large volume subarachnoid hemorrhage. Diffuse bilateral hemorrhage. Hemorrhage most prominent in the suprasellar cistern and interhemispheric fissure anteriorly. There is also blood in both sylvian fissures and around the brainstem. There is moderate intraventricular hemorrhage. 4 mm midline shift to the right. Mild hydrocephalus. No acute infarct or mass lesion. Vascular: Suspect aneurysmal type hemorrhage. Skull: No focal skull lesion Sinuses/Orbits: Paranasal sinuses clear.  Bilateral ocular  surgery Other: None IMPRESSION: 1. Very large volume subarachnoid hemorrhage. Pattern most compatible with aneurysm hemorrhage. 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 Intraventricular hemorrhage with mild hydrocephalus. Mild midline shift to the right. 2. CTA head recommended to evaluate for aneurysm. 3. These results were called by telephone at the time of interpretation on 06/01/2019 at 1:57 pm to provider Veryl Speak , who verbally acknowledged these results. Electronically Signed   By: Franchot Gallo M.D.   On: 05/07/2019 13:58    Procedures Procedure Name: Intubation Date/Time: 05/08/2019 3:57 PM Performed by: Veryl Speak, MD Pre-anesthesia Checklist: Patient identified, Patient being monitored, Emergency Drugs available, Timeout performed and Suction available Oxygen Delivery Method: Non-rebreather mask Preoxygenation: Pre-oxygenation with 100% oxygen Induction Type: Rapid sequence Ventilation: Mask ventilation without difficulty Tube size: 7.5 mm Number of attempts: 2 Placement Confirmation: ETT inserted through vocal cords under direct vision,  CO2 detector and Breath sounds checked- equal and bilateral Tube secured with: ETT holder Difficulty Due To: Difficult Airway- due to anterior larynx      (including critical care time)  Medications Ordered in ED Medications  propofol (DIPRIVAN) 1000 MG/100ML infusion (20 mcg/kg/min  77.1 kg Intravenous Rate/Dose Change 05/23/2019 1532)  nicardipine (CARDENE) 20mg  in 0.86% saline 264ml IV infusion (0.1 mg/ml) (0 mg/hr Intravenous Paused 05/05/2019 1511)  etomidate (AMIDATE) injection (20 mg Intravenous Given 05/27/2019 1417)  succinylcholine (ANECTINE) injection (150 mg Intravenous Given 05/28/2019 1417)    ED Course  I have reviewed the triage vital signs and the nursing notes.  Pertinent labs & imaging results that were available during my care of the patient were reviewed by me and considered in my medical decision making  (see chart for details).    MDM Rules/Calculators/A&P  Patient arrived here by EMS for evaluation of headache.  This started acutely this morning.  Patient became unresponsive while on the EMS stretcher shortly after arrival to the ER.  A code stroke was then initiated and patient was taken to radiology for a stat CT scan.  Unfortunately, this revealed a very large subarachnoid hemorrhage.  Patient became markedly hypertensive, and Cardene was initiated.  As patient was not protecting his airway, the decision was made to proceed with intubation.  RSI was performed using 20 mg of etomidate and 150 mg of succinylcholine.  Cords were visualized, however there was some difficulty with inserting the ET tube on the first pass.  The ET tube was retracted, then the patient back for a short period of time.  Oxygen saturations dropped into the 80s, however only for a few seconds.  The second attempted intubation was successful.  Cords were easily visualized using the glide scope and a 7.5 endotracheal tube was placed.  Tube placement was confirmed with direct visualization and auscultation over the chest and stomach.  Also with end-tidal CO2.  The CT findings were discussed with Dr. Arnoldo Morale.  We shared the opinion that the hemorrhage was large and that the patient had an extremely poor prognosis.  He strongly urged comfort measures.  I had a lengthy conversation with the patient's sister and patient's son who were present here in the ER.  I also spoke with the second son who lives in [place].  We have agreed that we will withdraw care and make the patient comfort measures due to his extremely poor prognosis.  CRITICAL CARE Performed by: Veryl Speak Total critical care time: 70 minutes Critical care time was exclusive of separately billable procedures and treating other patients. Critical care was necessary to treat or prevent imminent or life-threatening deterioration. Critical care was time spent  personally by me on the following activities: development of treatment plan with patient and/or surrogate as well as nursing, discussions with consultants, evaluation of patient's response to treatment, examination of patient, obtaining history from patient or surrogate, ordering and performing treatments and interventions, ordering and review of laboratory studies, ordering and review of radiographic studies, pulse oximetry and re-evaluation of patient's condition.   Final Clinical Impression(s) / ED Diagnoses Final diagnoses:  None    Rx / DC Orders ED Discharge Orders    None       Veryl Speak, MD 05/20/2019 1557    Veryl Speak, MD 05/07/2019 1559    Veryl Speak, MD 05/12/2019 1603

## 2019-05-16 NOTE — Progress Notes (Signed)
1332 call time 1332 beeper time 1334 exam started 1336 exam finished 1336 images sent to [place] exam completed in [facility] radiology called

## 2019-05-16 NOTE — ED Notes (Signed)
Called on-call Chaplain Jerral Ralph) as requested by nurse.

## 2019-05-16 NOTE — ED Notes (Signed)
Son, Starleen Arms [phone]

## 2019-05-16 NOTE — ED Notes (Signed)
Family at bedside, patient's sister and son.
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
## 2019-05-16 NOTE — ED Notes (Signed)
Family requesting hospital chaplain. Secretary paged chaplain on call, states he will arrive in approximately 20-25 minutes.

## 2019-05-16 NOTE — ED Provider Notes (Signed)
Patient was extubated around 530.  Patient was breathing on his own but unresponsive.  Patient will be given morphine 4 mg as needed agitation.  He will be admitted to medicine for comfort care as needed   Milton Ferguson, MD 05/23/2019 1735

## 2019-05-16 NOTE — ED Triage Notes (Signed)
Pt called ems out for headache that started suddenly about 20 minutes prior to calling ems. Pt was alert, able to answer questions, follow commands, upon arrival to er while waiting for bed pt had an episode of unresponsive. Dr Stark Jock at bedside, code stroke called, pt transported to ct, onset of time of unresponsive was 13:35

## 2019-05-16 NOTE — ED Notes (Signed)
Spoke with DR Maudie Mercury regarding pt status- received instruction not to re-start cardene drip, pt can remain on O2 2 L via [place], and orders for morphine and ativan are in if needed for pt comfort.  Family at bedside, (sister and son) and were updated on plan of care.

## 2019-05-16 NOTE — ED Notes (Addendum)
Dr Roderic Palau at bedside to extubate patient. Ventilator cut off and patient extubated by Dr Roderic Palau. No response from patient. Family stepped out of room during extubation. All medications stopped per Dr Roderic Palau.

## 2019-05-17 MED ORDER — GLYCOPYRROLATE 0.2 MG/ML IJ SOLN
0.1000 mg | Freq: Four times a day (QID) | INTRAMUSCULAR | Status: DC | PRN
  Administered 2019-05-17 – 2019-05-18 (×4): 0.1 mg via INTRAVENOUS
  Filled 2019-05-17 (×5): qty 1

## 2019-05-17 NOTE — Progress Notes (Signed)
PROGRESS NOTE    Kirk Taylor  [id number] DOB: 07/13/1933 DOA: 05/10/2019 PCP: Sharilyn Sites, MD  Outpatient Specialists:   Brief Narrative:  Patient is an 86 year old Caucasian male with past medical history significant for hypertension, hyperlipidemia and paroxysmal atrial fibrillation.  Patient was admitted with altered mentation.  On presentation to the hospital, patient blood pressure was found to be 212/73 mmHg.  CT scan of the brain revealed very large volume of subarachnoid hemorrhage, with pattern said to be most consistent with aneurysmal bleed.  Patient was initially intubated, however, after discussion with the neurosurgical team, patient was extubated to comfort.  Goal 9 for comfort care/measures.  05/17/2019: Patient seen alongside patient's son and daughter-in-law.  Patient's son is a Theme park manager from [place].  Patient is not responsive.  Patient has rattling sounds.  I explained to the patient son and daughter-in-law that death is imminent.  We will keep patient comfortable.  Nursing supervisor can pronounce patient if patient passes on.   Assessment & Plan:   Principal Problem:   Subarachnoid bleed (HCC) Active Problems:   High cholesterol   High blood pressure   Subarachnoid bleed with midline shift: -Not a candidate for surgical intervention -Extubated to comfort -Continue with comfort directed care. -Nursing supervisor to pronounce patient if patient passes on.   DVT prophylaxis: None Code Status: DNR Family Communication: Son and daughter-in-law Disposition Plan: Patient is comfort measures only.  Hospital day at expected   Consultants:   Neurosurgery.  Procedures:   Intubated and extubated to comfort  Antimicrobials:   None   Subjective: No history from patient Patient is nonresponsive  Objective: Vitals:   05/15/2019 2050 05/05/2019 2125 05/11/2019 2129 05/06/2019 2300  BP: (!) 185/90 (!) 193/93  (!) 184/81  Pulse: 87 90 89 90  Resp: (!) 24  (!) 24 (!) 24 (!) 24  Temp: (!) 101.3 F (38.5 C) (!) 101.7 F (38.7 C) (!) 101.7 F (38.7 C) (!) 100.9 F (38.3 C)  TempSrc:    Axillary  SpO2: (!) 87% (!) 89% (!) 88% 92%  Weight:    81.1 kg  Height:    5\' 7"  (1.702 m)    Intake/Output Summary (Last 24 hours) at 05/17/2019 1739 Last data filed at 05/17/2019 1300 Gross per 24 hour  Intake 0 ml  Output 1000 ml  Net -1000 ml   Filed Weights   05/04/2019 1358 05/04/2019 2300  Weight: 77.1 kg 81.1 kg    Examination:  General exam: Rattling sounds noted Respiratory system: Clear to auscultation.  Cardiovascular system: S1 & S2 heard Gastrointestinal system: Abdomen is obese, soft and nontender. No organomegaly or masses felt. Normal bowel sounds heard. Central nervous system: Not responsive.   Extremities: No leg edema  Data Reviewed: I have personally reviewed following labs and imaging studies  CBC: Recent Labs  Lab 05/17/2019 1415  WBC 10.2  NEUTROABS 7.8*  HGB 14.7  HCT 47.3  MCV 103.1*  PLT 0000000   Basic Metabolic Panel: Recent Labs  Lab 05/30/2019 1415  NA 139  K 4.2  CL 105  CO2 24  GLUCOSE 132*  BUN 15  CREATININE 0.92  CALCIUM 9.0   GFR: Estimated Creatinine Clearance: 61 mL/min (by C-G formula based on SCr of 0.92 mg/dL). Liver Function Tests: Recent Labs  Lab 05/26/2019 1415  AST 46*  ALT 34  ALKPHOS 75  BILITOT 1.2  PROT 7.7  ALBUMIN 4.0   No results for input(s): LIPASE, AMYLASE in the last 168 hours. No  results for input(s): AMMONIA in the last 168 hours. Coagulation Profile: Recent Labs  Lab 05/13/2019 1415  INR 1.4*   Cardiac Enzymes: No results for input(s): CKTOTAL, CKMB, CKMBINDEX, TROPONINI in the last 168 hours. BNP (last 3 results) No results for input(s): PROBNP in the last 8760 hours. HbA1C: No results for input(s): HGBA1C in the last 72 hours. CBG: No results for input(s): GLUCAP in the last 168 hours. Lipid Profile: No results for input(s): CHOL, HDL, LDLCALC, TRIG,  CHOLHDL, LDLDIRECT in the last 72 hours. Thyroid Function Tests: No results for input(s): TSH, T4TOTAL, FREET4, T3FREE, THYROIDAB in the last 72 hours. Anemia Panel: No results for input(s): VITAMINB12, FOLATE, FERRITIN, TIBC, IRON, RETICCTPCT in the last 72 hours. Urine analysis:    Component Value Date/Time   COLORURINE YELLOW 05/17/2019 1340   APPEARANCEUR CLEAR 05/19/2019 1340   LABSPEC 1.009 05/05/2019 1340   PHURINE 7.0 05/24/2019 1340   GLUCOSEU NEGATIVE 05/28/2019 1340   HGBUR NEGATIVE 05/17/2019 1340   BILIRUBINUR NEGATIVE 05/28/2019 1340   KETONESUR 5 (A) 05/15/2019 1340   PROTEINUR 100 (A) 05/06/2019 1340   NITRITE NEGATIVE 05/09/2019 1340   LEUKOCYTESUR NEGATIVE 05/09/2019 1340   Sepsis Labs: @LABRCNTIP (procalcitonin:4,lacticidven:4)  ) Recent Results (from the past 240 hour(s))  Respiratory Panel by RT PCR (Flu A&B, Covid) - Nasopharyngeal Swab     Status: None   Collection Time: 05/28/2019  1:52 PM   Specimen: Nasopharyngeal Swab  Result Value Ref Range Status   SARS Coronavirus 2 by RT PCR NEGATIVE NEGATIVE Final    Comment: (NOTE) SARS-CoV-2 target nucleic acids are NOT DETECTED. The SARS-CoV-2 RNA is generally detectable in upper respiratoy specimens during the acute phase of infection. The lowest concentration of SARS-CoV-2 viral copies this assay can detect is 131 copies/mL. A negative result does not preclude SARS-Cov-2 infection and should not be used as the sole basis for treatment or other patient management decisions. A negative result may occur with  improper specimen collection/handling, submission of specimen other than nasopharyngeal swab, presence of viral mutation(s) within the areas targeted by this assay, and inadequate number of viral copies (<131 copies/mL). A negative result must be combined with clinical observations, patient history, and epidemiological information. The expected result is Negative. Fact Sheet for Patients:    PinkCheek.be Fact Sheet for Healthcare Providers:  GravelBags.it This test is not yet ap proved or cleared by the [place] FDA and  has been authorized for detection and/or diagnosis of SARS-CoV-2 by FDA under an Emergency Use Authorization (EUA). This EUA will remain  in effect (meaning this test can be used) for the duration of the COVID-19 declaration under Section 564(b)(1) of the Act, 21 U.S.C. section 360bbb-3(b)(1), unless the authorization is terminated or revoked sooner.    Influenza A by PCR NEGATIVE NEGATIVE Final   Influenza B by PCR NEGATIVE NEGATIVE Final    Comment: (NOTE) The Xpert Xpress SARS-CoV-2/FLU/RSV assay is intended as an aid in  the diagnosis of influenza from Nasopharyngeal swab specimens and  should not be used as a sole basis for treatment. Nasal washings and  aspirates are unacceptable for Xpert Xpress SARS-CoV-2/FLU/RSV  testing. Fact Sheet for Patients: PinkCheek.be Fact Sheet for Healthcare Providers: GravelBags.it This test is not yet approved or cleared by the [place] FDA and  has been authorized for detection and/or diagnosis of SARS-CoV-2 by  FDA under an Emergency Use Authorization (EUA). This EUA will remain  in effect (meaning this test can be used) for the duration of  the  Covid-19 declaration under Section 564(b)(1) of the Act, 21  U.S.C. section 360bbb-3(b)(1), unless the authorization is  terminated or revoked. Performed at [facility], [address], [place], [place] [address]          Radiology Studies: DG Chest Portable 1 View  Result Date: 05/03/2019 CLINICAL DATA:  Hypoxia EXAM: PORTABLE CHEST 1 VIEW COMPARISON:  None. FINDINGS: Endotracheal tube tip is 7 mm above the carina. Nasogastric tube tip and side port are below the diaphragm. No pneumothorax. Lungs are clear. Heart is mildly enlarged with  pulmonary vascularity normal. No adenopathy. There is aortic atherosclerosis. There is arthropathy in the right shoulder with superior migration of the right humeral head. IMPRESSION: Tube positions as described without pneumothorax. Note that the endotracheal tube is immediately proximal to the carina. Advise withdrawing endotracheal tube approximately 3 cm. Lungs clear. Heart mildly enlarged. No adenopathy. Apparent superior migration of the right humeral head, a finding indicative of chronic rotator cuff tear on the right. Electronically Signed   By: Lowella Grip III M.D.   On: 05/17/2019 14:42   CT HEAD CODE STROKE WO CONTRAST  Result Date: 06/01/2019 CLINICAL DATA:  Code stroke.  Headache.  Now unresponsive EXAM: CT HEAD WITHOUT CONTRAST TECHNIQUE: Contiguous axial images were obtained from the base of the skull through the vertex without intravenous contrast. COMPARISON:  None. FINDINGS: Brain: Large volume subarachnoid hemorrhage. Diffuse bilateral hemorrhage. Hemorrhage most prominent in the suprasellar cistern and interhemispheric fissure anteriorly. There is also blood in both sylvian fissures and around the brainstem. There is moderate intraventricular hemorrhage. 4 mm midline shift to the right. Mild hydrocephalus. No acute infarct or mass lesion. Vascular: Suspect aneurysmal type hemorrhage. Skull: No focal skull lesion Sinuses/Orbits: Paranasal sinuses clear.  Bilateral ocular surgery Other: None IMPRESSION: 1. Very large volume subarachnoid hemorrhage. Pattern most compatible with aneurysm hemorrhage. 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 Intraventricular hemorrhage with mild hydrocephalus. Mild midline shift to the right. 2. CTA head recommended to evaluate for aneurysm. 3. These results were called by telephone at the time of interpretation on 05/04/2019 at 1:57 pm to provider Veryl Speak , who verbally acknowledged these results. Electronically Signed   By: Franchot Gallo M.D.   On: 05/14/2019 13:58        Scheduled Meds: Continuous Infusions:   LOS: 1 day    Time spent: 35 minutes    Dana Allan, MD  [facility] Pager #: [phone] 7PM-7AM contact night coverage as above

## 2019-05-18 ENCOUNTER — Other Ambulatory Visit: Payer: Self-pay

## 2019-05-18 MED ORDER — MORPHINE SULFATE (PF) 2 MG/ML IV SOLN
1.0000 mg | INTRAVENOUS | Status: DC | PRN
  Administered 2019-05-18: 1 mg via INTRAVENOUS
  Filled 2019-05-18: qty 1

## 2019-06-02 NOTE — Plan of Care (Signed)

## 2019-06-02 NOTE — Progress Notes (Signed)
Patient expired at 1448. Family at bedside. MD notified. Post mortem checklist completed. [place] [facility] called. Post mortem care completed.

## 2019-06-02 NOTE — Discharge Summary (Signed)
Death discharge summary Admitted: Jun 06, 2019 Date of Death: 07-[birth year]2018  Cause of death: Very large subarachnoid hemorrhage, compatible with aneurysmal bleed.  Brief history: Patient was an 86 year old Caucasian male with past medical history significant for hypertension, hyperlipidemia and paroxysmal atrial fibrillation.  Patient was admitted with altered mentation.  On presentation to the hospital, patient blood pressure was found to be 212/73 mmHg.  CT scan of the brain revealed very large volume of subarachnoid hemorrhage, with pattern said to be most consistent with aneurysmal bleed.  Patient was initially intubated, however, after discussing with the neurosurgical team, patient was extubated to comfort.  Patient eventually died on 2019-06-08.

## 2019-06-02 DEATH — deceased

## 2019-07-17 ENCOUNTER — Ambulatory Visit: Payer: Medicare Other | Admitting: Cardiology

## 2021-08-06 IMAGING — CT CT HEAD CODE STROKE
3 series · 15 of 47 positions shown, 18 images · non-contrast
Comparison: None.

CLINICAL DATA: Code stroke.  Headache.  Now unresponsive

EXAM:
CT HEAD WITHOUT CONTRAST
TECHNIQUE: Contiguous axial images were obtained from the base of the skull
through the vertex without intravenous contrast.

[Series 2: head w o · axial · 0.48mm/px · z∈[+40,+175]mm · 9 of 33 slices shown, 12 images]
[im 3/33  brain]
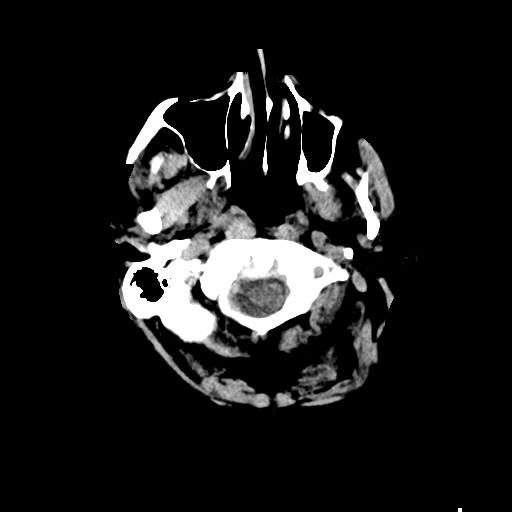
[im 3/33  bone]
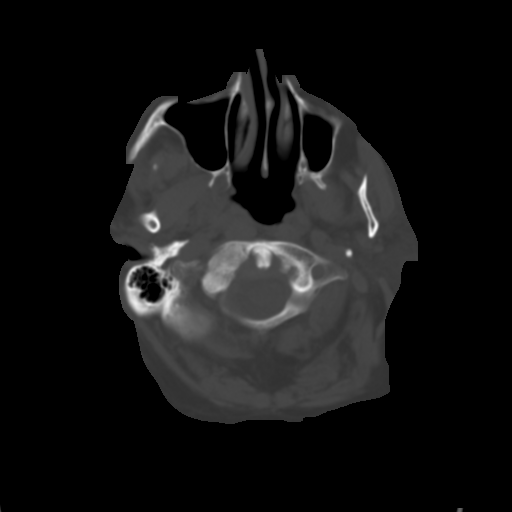
[im 6/33  brain]
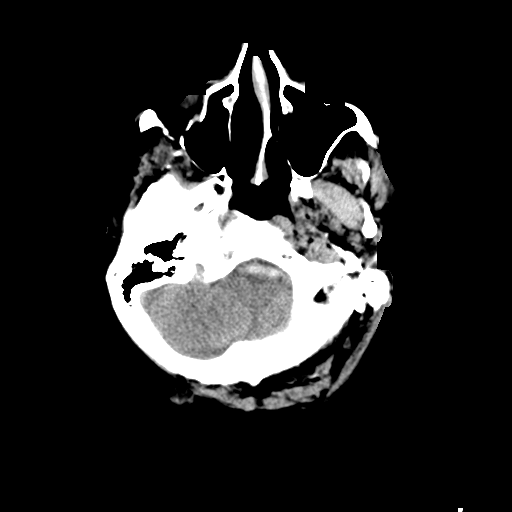
[im 9/33  brain]
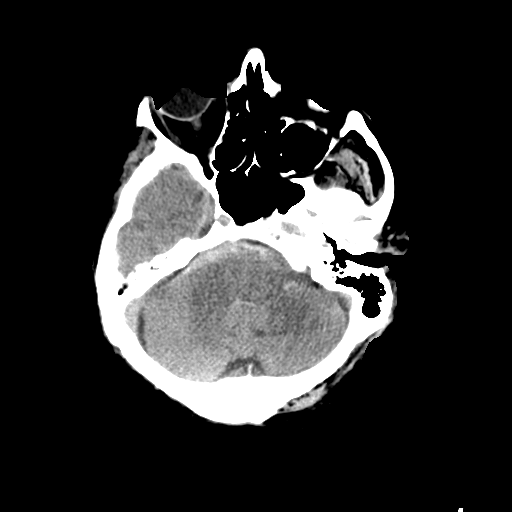
[im 13/33  brain]
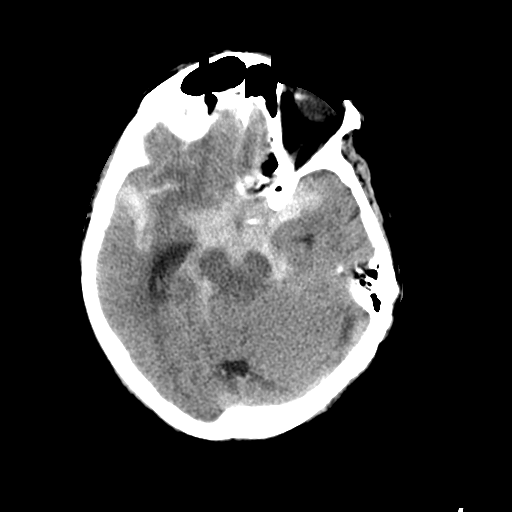
[im 17/33  brain]
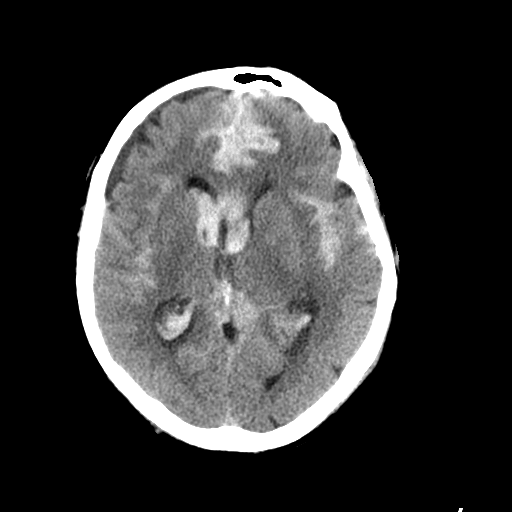
[im 17/33  bone]
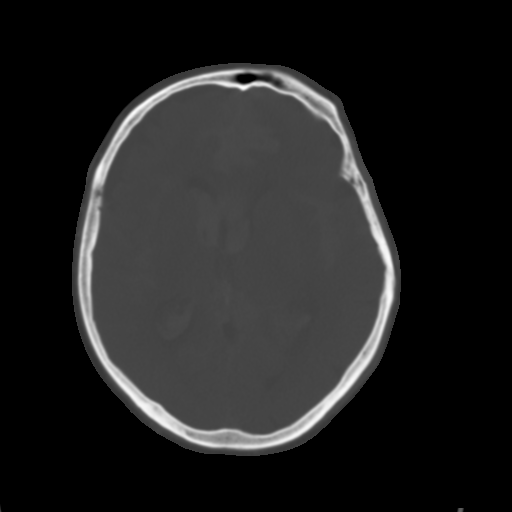
[im 20/33  brain]
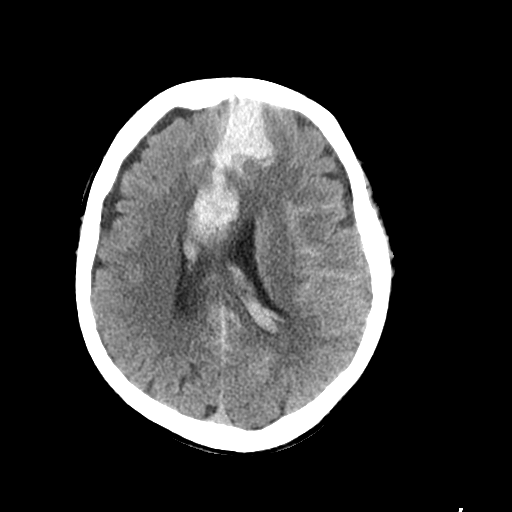
[im 24/33  brain]
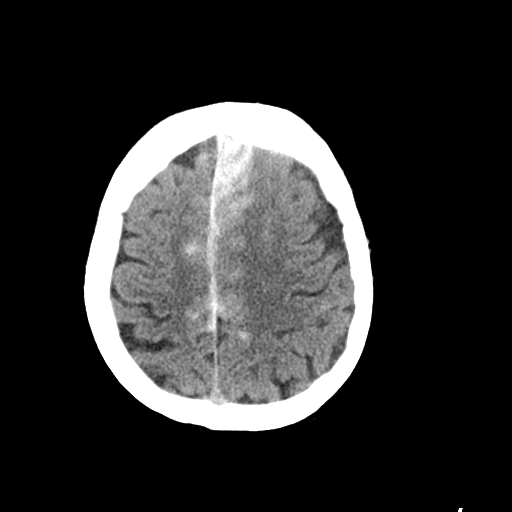
[im 27/33  brain]
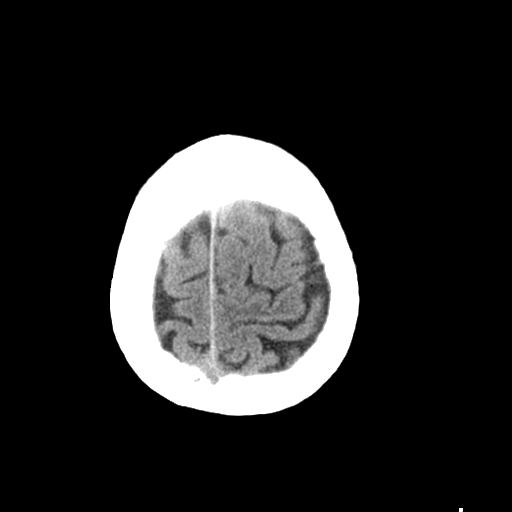
[im 30/33  brain]
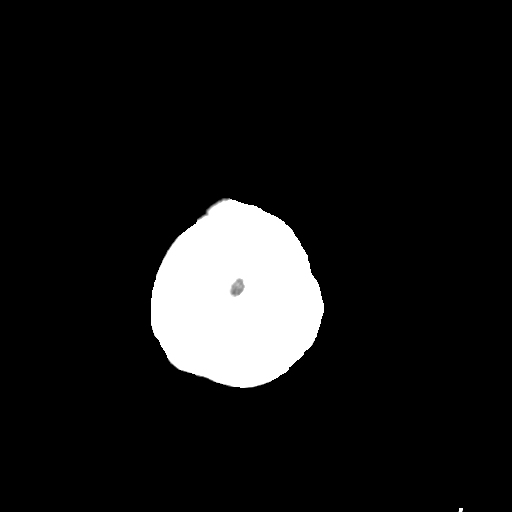
[im 30/33  bone]
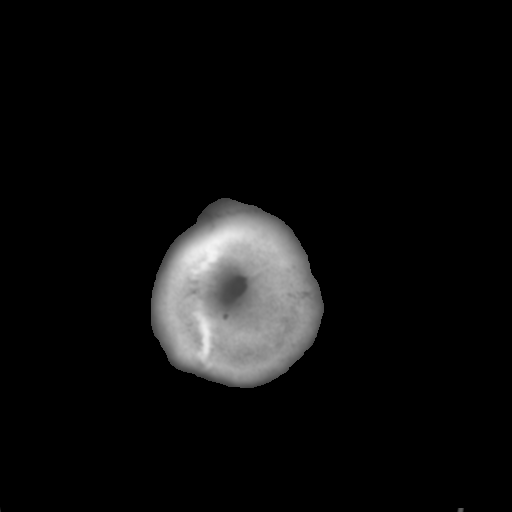

[Series 4: coronal soft · coronal · 0.33mm/px · 3 of 72 slices shown]
[im 24/72  brain]
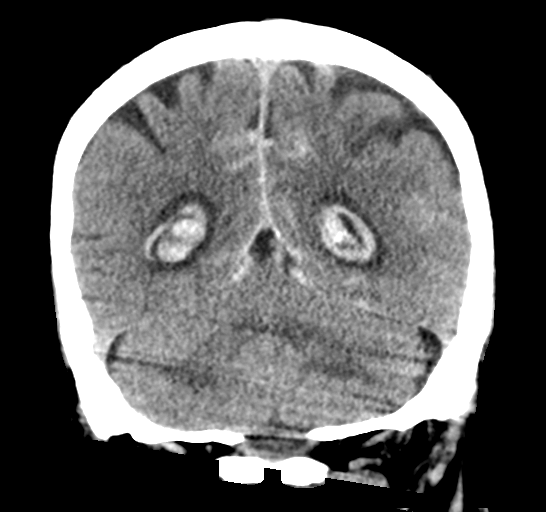
[im 32/72  brain]
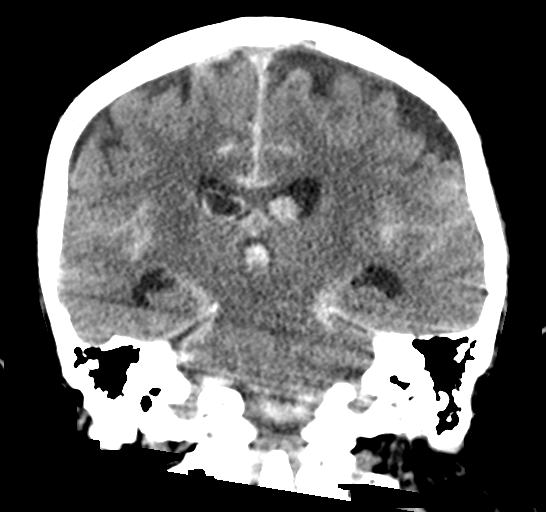
[im 40/72  brain]
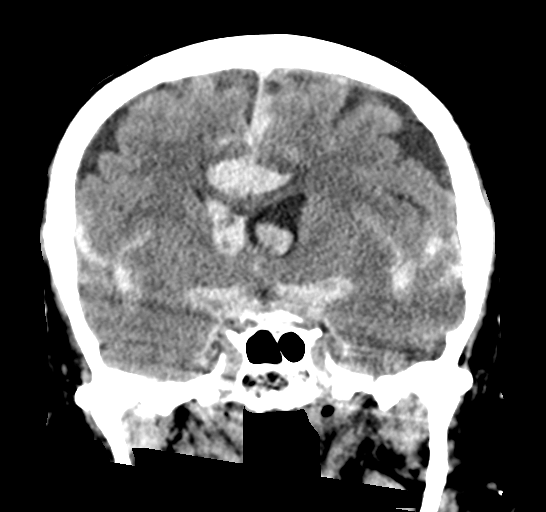

[Series 5: sagittal soft · sagittal · 0.38mm/px · 3 of 60 slices shown]
[im 23/60  brain]
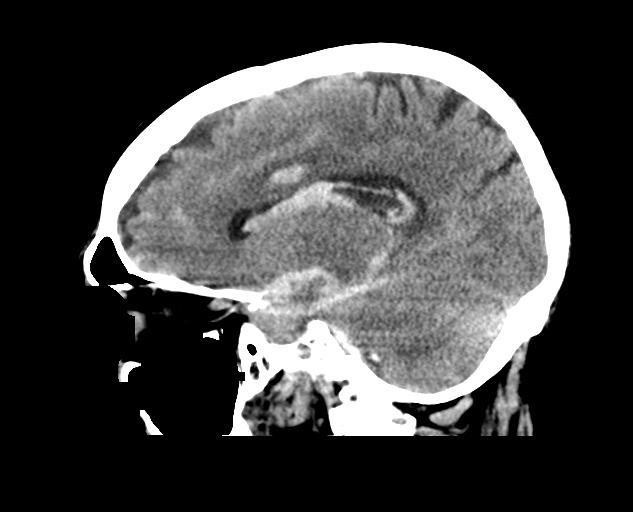
[im 30/60  brain]
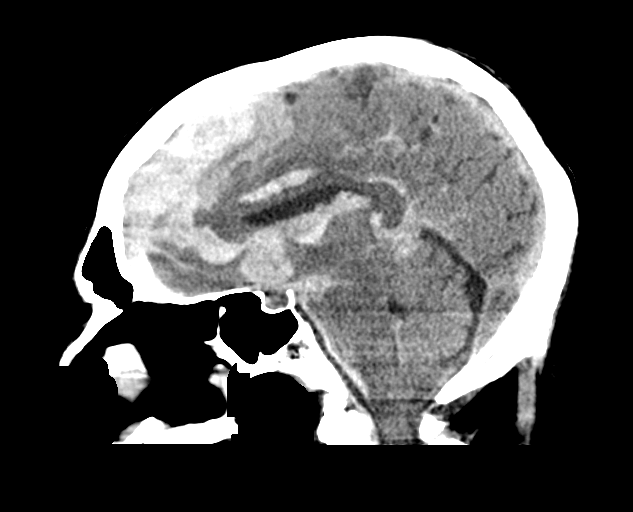
[im 37/60  brain]
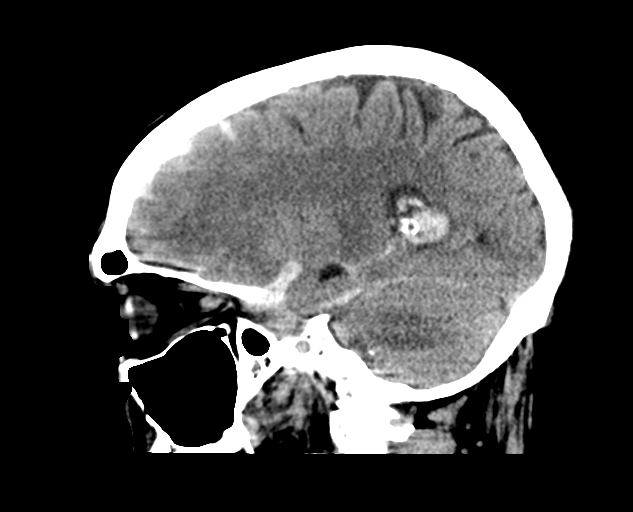

[15 of 47 positions shown; findings below may reference images not displayed]

FINDINGS: Brain: Large volume subarachnoid hemorrhage. Diffuse bilateral
hemorrhage. Hemorrhage most prominent in the suprasellar cistern and
interhemispheric fissure anteriorly. There is also blood in both
sylvian fissures and around the brainstem. There is moderate
intraventricular hemorrhage. 4 mm midline shift to the right. Mild
hydrocephalus.

No acute infarct or mass lesion.

Vascular: Suspect aneurysmal type hemorrhage.

Skull: No focal skull lesion

Sinuses/Orbits: Paranasal sinuses clear.  Bilateral ocular surgery

Other: None
IMPRESSION: 1. Very large volume subarachnoid hemorrhage. Pattern most
compatible with aneurysm hemorrhage. Possible anterior communicating
artery aneurysm rupture. Intraventricular hemorrhage with mild
hydrocephalus. Mild midline shift to the right.
2. CTA head recommended to evaluate for aneurysm.
3. These results were called by telephone at the time of
interpretation on 05/16/2019 at [DATE] to provider RUUD VAN NAFARRATE ,
who verbally acknowledged these results.
